# Patient Record
Sex: Female | Born: 1950 | Race: Black or African American | Hispanic: No | Smoking: Former smoker
Health system: Southern US, Community
[De-identification: ages and names within clinical notes are randomized; demographics above are authoritative.]

## PROBLEM LIST (undated history)

## (undated) DIAGNOSIS — K259 Gastric ulcer, unspecified as acute or chronic, without hemorrhage or perforation: Secondary | ICD-10-CM

## (undated) DIAGNOSIS — I10 Essential (primary) hypertension: Secondary | ICD-10-CM

## (undated) DIAGNOSIS — R7303 Prediabetes: Secondary | ICD-10-CM

## (undated) DIAGNOSIS — M1990 Unspecified osteoarthritis, unspecified site: Secondary | ICD-10-CM

## (undated) HISTORY — DX: Essential (primary) hypertension: I10

## (undated) HISTORY — DX: Gastric ulcer, unspecified as acute or chronic, without hemorrhage or perforation: K25.9

---

## 1978-09-26 DIAGNOSIS — K259 Gastric ulcer, unspecified as acute or chronic, without hemorrhage or perforation: Secondary | ICD-10-CM

## 1978-09-26 HISTORY — DX: Gastric ulcer, unspecified as acute or chronic, without hemorrhage or perforation: K25.9

## 2008-05-23 ENCOUNTER — Emergency Department (HOSPITAL_COMMUNITY): Admission: EM | Admit: 2008-05-23 | Discharge: 2008-05-23 | Payer: Self-pay | Admitting: Emergency Medicine

## 2008-08-06 ENCOUNTER — Observation Stay (HOSPITAL_COMMUNITY): Admission: EM | Admit: 2008-08-06 | Discharge: 2008-08-08 | Payer: Self-pay | Admitting: Emergency Medicine

## 2008-09-26 HISTORY — PX: ANKLE FRACTURE SURGERY: SHX122

## 2011-02-08 NOTE — Op Note (Signed)
NAMECONNER, NEISS NO.:  [id number]   MEDICAL RECORD NO.:  [id number]          PATIENT TYPE:  INP   LOCATION:  5038                         FACILITY:  [facility]   PHYSICIAN:  Claude Manges. Whitfield, M.D.DATE OF BIRTH:  01-Jan-1951   DATE OF PROCEDURE:  08/06/2008  DATE OF DISCHARGE:                               OPERATIVE REPORT   PREOPERATIVE DIAGNOSIS:  Displaced left distal fibular fracture with a  tear of the deltoid ligament.   POSTOPERATIVE DIAGNOSIS:  Displaced left distal fibular fracture with a  tear of the deltoid ligament.   PROCEDURES:  1. Open reduction internal fixation, left distal fibula fracture.  2. Repair of deltoid ligament.   SURGEON:  Claude Manges. Cleophas Dunker, MD   ASSISTANT:  Oris Drone. Petrarca, PA-C   ANESTHESIA:  General orotracheal.   COMPLICATIONS:  None.   DESCRIPTION OF PROCEDURE:  The patient was met in the holding area.  The  left lower extremity was marked as the appropriate operative extremity.  The patient was then taken to room #5 where she was placed under general  orotracheal anesthesia.   Tourniquet was applied to the left thigh.  The left leg was then prepped  with Betadine scrub and then DuraPrep in the tips of the toes and the  knee.  Sterile draping was performed.   With the extremity elevated, there was Esmarch exsanguinated with a  proximal tourniquet at 350 mmHg.   A preoperative films and CT scan of the left ankle revealed a displaced  oblique fracture of the distal fibula with the distal extent of the  fracture at the ankle mortise.  CT scan revealed the fibula was well  approximated to the distal tibia without evidence of tib-fib ligament  injury.  There was also a large space medially with lateral position of  the talus consistent with a deltoid ligament injury.  There was a large  gap that we could palpate.  I had some concerns that there may have been  some invagination of the deltoid ligament into the joint.   Accordingly,  initial incision was made along the deltoid ligament.  It was about an  inch length.  Sharp dissection carried down the subcutaneous tissue then  via blunt dissection, subcutaneous tissue was elevated.  The joint was  easily entered.  Hematoma was evacuated.  The deltoid ligament  completely avulsed from the fibula.  It actually had shredded, so there  was a portion had torned distally in a portion from the fifth fibula of  the medial malleolus with periosteal stripping.  The joint was  irrigated.  Part of the ligament had invaginated into the medial sulcus.   We then made a longitudinal incision over the distal fibula and sharp  dissection carried down the subcutaneous tissue and via blunt  dissection, we opened the fascia and entered the periosteum.  There was  immediate hematoma.  The fracture was directly visualized.  There was  oblique beginning proximally posteriorly and then extending anteriorly  and distally.  It was irrigated with saline solution.  Under direct  visualization, we had  to look into the joint as we opened the fracture.  There were no bony fragments.  Under direct visualization, the fracture  was then reduced.  We inserted a single transverse screw at the mid  fracture level after over drilling anteriorly and then filling it with  an appropriate sized cortical screw.  We had excellent purchase of the  screw.  We would just release the bone holding clamp and there was no  motion at the fracture.   A 7-hole semitubular Synthes plate was then applied.  It was bent to  conform to the shape of the fibula.  We sequentially drilled, measured,  and filled the holes with self-tapping cortical screws proximally.  The  distal two screws were filled with fully threaded cancellus screws.  Each of the screws had very nice purchase, so we thought they were well-  approximated to the fibula.  Intensification films were obtained and the  fracture line was obliterated.   We felt we had excellent position of the  screws.   The wound was irrigated with saline solution.  The periosteum closed  anatomically with a running 0 Vicryl, the subcu with similar material,  and the skin closed with skin clips.   We then readdressed the medial malleolar incision.  The wound was again  irrigated.  The deltoid ligament was reapproximated with the 2-0 Vicryl.  The wound was again irrigated with saline solution, the subcu closed  with 2-0 Vicryl, and the skin closed with skin clips.  Sterile bulky  dressing was applied followed by a coaptation and posterior splints and  an Ace bandage.  Tourniquet was deflated with immediate capillary refill  to the toes.   The patient tolerated the procedure without complications.      Claude Manges. Cleophas Dunker, M.D.  Electronically Signed     PWW/MEDQ  D:  08/06/2008  T:  08/07/2008  Job:  [id number]

## 2011-06-28 LAB — DIFFERENTIAL
Basophils Relative: 0
Monocytes Absolute: 0.3
Neutro Abs: 10.3 — ABNORMAL HIGH

## 2011-06-28 LAB — BASIC METABOLIC PANEL
Calcium: 9.4
Chloride: 109
Creatinine, Ser: 0.84
GFR calc Af Amer: >60
Glucose, Bld: 118 — ABNORMAL HIGH
Potassium: 4.1
Sodium: 138

## 2011-06-28 LAB — PROTIME-INR
INR: 0.9
Prothrombin Time: 12.3

## 2011-06-28 LAB — CBC
MCHC: 34
MCV: 80.1
WBC: 11.6 — ABNORMAL HIGH

## 2011-06-28 LAB — ABO/RH: ABO/RH(D): A POS

## 2011-06-28 LAB — TYPE AND SCREEN
ABO/RH(D): A POS
Antibody Screen: NEGATIVE

## 2011-06-28 LAB — APTT: aPTT: 28

## 2013-07-25 ENCOUNTER — Encounter: Payer: Self-pay | Admitting: Gastroenterology

## 2013-09-10 ENCOUNTER — Encounter: Payer: Self-pay | Admitting: Gastroenterology

## 2013-09-10 ENCOUNTER — Ambulatory Visit (AMBULATORY_SURGERY_CENTER): Payer: BC Managed Care – PPO | Admitting: *Deleted

## 2013-09-10 VITALS — Ht 66.5 in | Wt 221.8 lb

## 2013-09-10 DIAGNOSIS — Z1211 Encounter for screening for malignant neoplasm of colon: Secondary | ICD-10-CM

## 2013-09-10 MED ORDER — MOVIPREP 100 G PO SOLR: ORAL | Status: DC

## 2013-09-10 NOTE — Progress Notes (Signed)
No allergies to eggs or soy. No problems with anesthesia.  

## 2013-09-24 ENCOUNTER — Encounter: Payer: Self-pay | Admitting: Gastroenterology

## 2013-09-24 ENCOUNTER — Ambulatory Visit (AMBULATORY_SURGERY_CENTER): Payer: BC Managed Care – PPO | Admitting: Gastroenterology

## 2013-09-24 VITALS — BP 143/88 | HR 78 | Temp 96.3°F | Resp 26 | Ht 66.5 in | Wt 221.0 lb

## 2013-09-24 DIAGNOSIS — Z1211 Encounter for screening for malignant neoplasm of colon: Secondary | ICD-10-CM

## 2013-09-24 MED ORDER — SODIUM CHLORIDE 0.9 % IV SOLN: 500.0000 mL | INTRAVENOUS | Status: DC

## 2013-09-24 NOTE — Progress Notes (Signed)
Patient did not experience any of the following events: a burn prior to discharge; a fall within the facility; wrong site/side/patient/procedure/implant event; or a hospital transfer or hospital admission upon discharge from the facility. (G8907) Patient did not have preoperative order for IV antibiotic SSI prophylaxis. (G8918)  

## 2013-09-24 NOTE — Op Note (Signed)
[facility] [address] [place], [address]   COLONOSCOPY PROCEDURE REPORT  PATIENT: Angelica Yates, Angelica Yates  MR#: [id number] BIRTHDATE: April 07, 1951 , 62  yrs. old GENDER: Female ENDOSCOPIST: Rachael Fee, MD PROCEDURE DATE:  09/24/2013 PROCEDURE:   Colonoscopy, screening First Screening Colonoscopy - Avg.  risk and is 50 yrs.  old or older - No.  Prior Negative Screening - Now for repeat screening. N/A  History of Adenoma - Now for follow-up colonoscopy & has been > or = to 3 yrs.  N/A  Polyps Removed Today? No.  Recommend repeat exam, <10 yrs? No. ASA CLASS:   Class II INDICATIONS:average risk screening. MEDICATIONS: Fentanyl 75 mcg IV, Versed 8 mg IV, and These medications were titrated to patient response per physician's verbal order  DESCRIPTION OF PROCEDURE:   After the risks benefits and alternatives of the procedure were thoroughly explained, informed consent was obtained.  A digital rectal exam revealed no abnormalities of the rectum.   The LB [id number]  endoscope was introduced through the anus and advanced to the cecum, which was identified by both the appendix and ileocecal valve. No adverse events experienced.   The quality of the prep was good.  The instrument was then slowly withdrawn as the colon was fully examined.    COLON FINDINGS: A normal appearing cecum, ileocecal valve, and appendiceal orifice were identified.  The ascending, hepatic flexure, transverse, splenic flexure, descending, sigmoid colon and rectum appeared unremarkable.  No polyps or cancers were seen. Retroflexed views revealed no abnormalities. The time to cecum=2 minutes 43 seconds.  Withdrawal time=8 minutes 32 seconds.  The scope was withdrawn and the procedure completed. COMPLICATIONS: There were no complications.  ENDOSCOPIC IMPRESSION: Normal colon No polyps or cancers  RECOMMENDATIONS: You should continue to follow colorectal cancer screening guidelines for  "routine risk" patients with a repeat colonoscopy in 10 years.    eSigned:  Rachael Fee, MD 09/24/2013 9:58 AM

## 2013-09-24 NOTE — Patient Instructions (Signed)
Discharge instructions given with verbal understanding. Normal exam. Resume previous medications. YOU HAD AN ENDOSCOPIC PROCEDURE TODAY AT THE [facility]: Refer to the procedure report that was given to you for any specific questions about what was found during the examination.  If the procedure report does not answer your questions, please call your gastroenterologist to clarify.  If you requested that your care partner not be given the details of your procedure findings, then the procedure report has been included in a sealed envelope for you to review at your convenience later.  YOU SHOULD EXPECT: Some feelings of bloating in the abdomen. Passage of more gas than usual.  Walking can help get rid of the air that was put into your GI tract during the procedure and reduce the bloating. If you had a lower endoscopy (such as a colonoscopy or flexible sigmoidoscopy) you may notice spotting of blood in your stool or on the toilet paper. If you underwent a bowel prep for your procedure, then you may not have a normal bowel movement for a few days.  DIET: Your first meal following the procedure should be a light meal and then it is ok to progress to your normal diet.  A half-sandwich or bowl of soup is an example of a good first meal.  Heavy or fried foods are harder to digest and may make you feel nauseous or bloated.  Likewise meals heavy in dairy and vegetables can cause extra gas to form and this can also increase the bloating.  Drink plenty of fluids but you should avoid alcoholic beverages for 24 hours.  ACTIVITY: Your care partner should take you home directly after the procedure.  You should plan to take it easy, moving slowly for the rest of the day.  You can resume normal activity the day after the procedure however you should NOT DRIVE or use heavy machinery for 24 hours (because of the sedation medicines used during the test).    SYMPTOMS TO REPORT IMMEDIATELY: A gastroenterologist  can be reached at any hour.  During normal business hours, 8:30 AM to 5:00 PM Monday through Friday, call [phone].  After hours and on weekends, please call the GI answering service at [phone] who will take a message and have the physician on call contact you.   Following lower endoscopy (colonoscopy or flexible sigmoidoscopy):  Excessive amounts of blood in the stool  Significant tenderness or worsening of abdominal pains  Swelling of the abdomen that is new, acute  Fever of 100F or higher  FOLLOW UP: If any biopsies were taken you will be contacted by phone or by letter within the next 1-3 weeks.  Call your gastroenterologist if you have not heard about the biopsies in 3 weeks.  Our staff will call the home number listed on your records the next business day following your procedure to check on you and address any questions or concerns that you may have at that time regarding the information given to you following your procedure. This is a courtesy call and so if there is no answer at the home number and we have not heard from you through the emergency physician on call, we will assume that you have returned to your regular daily activities without incident.  SIGNATURES/CONFIDENTIALITY: You and/or your care partner have signed paperwork which will be entered into your electronic medical record.  These signatures attest to the fact that that the information above on your After Visit Summary has been reviewed   and is understood.  Full responsibility of the confidentiality of this discharge information lies with you and/or your care-partner. 

## 2013-09-25 ENCOUNTER — Telehealth: Payer: Self-pay | Admitting: *Deleted

## 2013-09-25 NOTE — Telephone Encounter (Signed)
  Follow up Call-  Call back number 09/24/2013  Post procedure Call Back phone  # [phone] cell  Permission to leave phone message Yes     Patient questions:  Do you have a fever, pain , or abdominal swelling? no Pain Score  0 *  Have you tolerated food without any problems? yes  Have you been able to return to your normal activities? yes  Do you have any questions about your discharge instructions: Diet   no Medications  no Follow up visit  no  Do you have questions or concerns about your Care? no  Actions: * If pain score is 4 or above: No action needed, pain <4.

## 2017-12-29 ENCOUNTER — Ambulatory Visit: Payer: BC Managed Care – PPO | Admitting: Physician Assistant

## 2017-12-29 ENCOUNTER — Encounter: Payer: Self-pay | Admitting: Physician Assistant

## 2017-12-29 ENCOUNTER — Encounter (INDEPENDENT_AMBULATORY_CARE_PROVIDER_SITE_OTHER): Payer: Self-pay

## 2017-12-29 VITALS — BP 142/84 | HR 72 | Ht 66.5 in | Wt 220.5 lb

## 2017-12-29 DIAGNOSIS — Z8711 Personal history of peptic ulcer disease: Secondary | ICD-10-CM

## 2017-12-29 DIAGNOSIS — Z8719 Personal history of other diseases of the digestive system: Secondary | ICD-10-CM

## 2017-12-29 DIAGNOSIS — R1013 Epigastric pain: Secondary | ICD-10-CM | POA: Diagnosis not present

## 2017-12-29 MED ORDER — OMEPRAZOLE 40 MG PO CPDR: 40.0000 mg | DELAYED_RELEASE_CAPSULE | Freq: Every day | ORAL | 1 refills | Status: DC

## 2017-12-29 MED ORDER — OMEPRAZOLE 20 MG PO CPDR: 20.0000 mg | DELAYED_RELEASE_CAPSULE | Freq: Every day | ORAL | 1 refills | Status: DC

## 2017-12-29 NOTE — Progress Notes (Signed)
Chief Complaint: Epigastric abdominal pain  HPI:    Angelica Yates is a 67 year old female with a past medical history as listed below who has followed with Dr. Christella Hartigan for her screening colonoscopy and presents to clinic today as a new patient for a complaint of abdominal pain.    09/24/13 colonoscopy, normal colon with no polyps or cancers.  Repeat recommended in 10 years.    07/13/17 CMP and CBC normal.     Today, explains that over the past year she has episodes after eating citrus fruits or a lot of cabbage where she will develop epigastric abdominal pain rated as an 8/10 which is sharp in nature.  She will then start over-the-counter Mylanta and use it a couple times a day for the next week and this pain goes away.  It has happened may be 5-6 weeks out of the past year.  If she avoids these certain foods she can avoid symptoms.  Associated with nausea.    Describes history of previous gastric ulcer which perforated.  She did have surgery for this back in the 90s.    Denies fever, chills, blood in her stool, melena, weight loss, fatigue, anorexia, vomiting, heartburn, reflux, use of NSAIDs or symptoms that awaken her at night.  Past Medical History:  Diagnosis Date  . Hypertension   . Stomach ulcer 1980    Past Surgical History:  Procedure Laterality Date  . ANKLE FRACTURE SURGERY Left 2010   with hardware    Current Outpatient Medications  Medication Sig Dispense Refill  . lisinopril-hydrochlorothiazide (PRINZIDE,ZESTORETIC) 20-12.5 MG per tablet Take 1 tablet by mouth daily.    . Multiple Vitamin (MULTIVITAMIN) tablet Take 1 tablet by mouth daily.     No current facility-administered medications for this visit.     Allergies as of 12/29/2017  . (No Known Allergies)    Family History  Problem Relation Age of Onset  . Colon cancer Neg Hx   . Esophageal cancer Neg Hx   . Rectal cancer Neg Hx   . Stomach cancer Neg Hx     Social History   Socioeconomic History  .  Marital status: Divorced    Spouse name: Not on file  . Number of children: Not on file  . Years of education: Not on file  . Highest education level: Not on file  Occupational History  . Not on file  Social Needs  . Financial resource strain: Not on file  . Food insecurity:    Worry: Not on file    Inability: Not on file  . Transportation needs:    Medical: Not on file    Non-medical: Not on file  Tobacco Use  . Smoking status: Former Smoker    Last attempt to quit: 09/26/1990    Years since quitting: 27.2  . Smokeless tobacco: Never Used  Substance and Sexual Activity  . Alcohol use: No  . Drug use: No  . Sexual activity: Not on file  Lifestyle  . Physical activity:    Days per week: Not on file    Minutes per session: Not on file  . Stress: Not on file  Relationships  . Social connections:    Talks on phone: Not on file    Gets together: Not on file    Attends religious service: Not on file    Active member of club or organization: Not on file    Attends meetings of clubs or organizations: Not on file    Relationship  status: Not on file  . Intimate partner violence:    Fear of current or ex partner: Not on file    Emotionally abused: Not on file    Physically abused: Not on file    Forced sexual activity: Not on file  Other Topics Concern  . Not on file  Social History Narrative  . Not on file    Review of Systems:    Constitutional: No weight loss, fever or chills Skin: No rash  Cardiovascular: No chest pain Respiratory: No SOB Gastrointestinal: See HPI and otherwise negative Genitourinary: No dysuria  Neurological: No headache Musculoskeletal: No new muscle or joint pain Hematologic: No bleeding  Psychiatric: No history of depression or anxiety   Physical Exam:  Vital signs: BP (!) 142/84   Pulse 72   Ht 5' 6.5" (1.689 m)   Wt 220 lb 8 oz (100 kg)   BMI 35.06 kg/m    Constitutional:   Pleasant obese AA female appears to be in NAD, Well  developed, Well nourished, alert and cooperative Head:  Normocephalic and atraumatic. Eyes:   PEERL, EOMI. No icterus. Conjunctiva pink. Ears:  Normal auditory acuity. Neck:  Supple Throat: Oral cavity and pharynx without inflammation, swelling or lesion.  Respiratory: Respirations even and unlabored. Lungs clear to auscultation bilaterally.   No wheezes, crackles, or rhonchi.  Cardiovascular: Normal S1, S2. No MRG. Regular rate and rhythm. No peripheral edema, cyanosis or pallor.  Gastrointestinal:  +midline surgical scar Soft, nondistended, nontender. No rebound or guarding. Normal bowel sounds. No appreciable masses or hepatomegaly. Rectal:  Not performed.  Msk:  Symmetrical without gross deformities. Without edema, no deformity or joint abnormality.  Neurologic:  Alert and  oriented x4;  grossly normal neurologically.  Skin:   Dry and intact without significant lesions or rashes. Psychiatric: Demonstrates good judgement and reason without abnormal affect or behaviors.  See HPI for most recent labs.  Assessment: 1. Epigastric Abdominal Pain: Episodic, brought on by certain foods, able to avoid if avoids food triggers, relieved with Mylanta times 1 week, history of gastric ulcer; most likely gastritis  Plan: 1.  Patient would like to try Omeprazole 20 mg daily, 30-60 minutes before breakfast #30 with 1 refill for the next 1-2 months. 2.  Reviewed antireflux diet and lifestyle modifications. 3.  Patient to follow in clinic with me in 4-6 weeks.  If she has a flare of symptoms while on Omeprazole could consider EGD for further evaluation.  Hyacinth MeekerJennifer Makhari Dovidio, PA-C [facility] 12/29/2017, 9:17 AM

## 2017-12-29 NOTE — Patient Instructions (Signed)
We have sent the following medications to your pharmacy for you to pick up at your convenience:  Omeprazole 20 mg daily 30-60 minutes before breakfast

## 2017-12-29 NOTE — Progress Notes (Signed)
I agree with the above note, plan 

## 2018-01-24 ENCOUNTER — Ambulatory Visit: Payer: BC Managed Care – PPO | Admitting: Physician Assistant

## 2019-11-25 ENCOUNTER — Ambulatory Visit: Payer: BC Managed Care – PPO | Attending: Internal Medicine

## 2020-02-05 ENCOUNTER — Emergency Department (HOSPITAL_COMMUNITY): Payer: No Typology Code available for payment source

## 2020-02-05 ENCOUNTER — Emergency Department (HOSPITAL_COMMUNITY)
Admission: EM | Admit: 2020-02-05 | Discharge: 2020-02-05 | Disposition: A | Discharge status: Home / Self Care | Payer: No Typology Code available for payment source | Attending: Emergency Medicine | Admitting: Emergency Medicine

## 2020-02-05 ENCOUNTER — Encounter (HOSPITAL_COMMUNITY): Payer: Self-pay | Admitting: Emergency Medicine

## 2020-02-05 DIAGNOSIS — Y9389 Activity, other specified: Secondary | ICD-10-CM | POA: Diagnosis not present

## 2020-02-05 DIAGNOSIS — S8002XA Contusion of left knee, initial encounter: Secondary | ICD-10-CM | POA: Diagnosis not present

## 2020-02-05 DIAGNOSIS — Y990 Civilian activity done for income or pay: Secondary | ICD-10-CM | POA: Diagnosis not present

## 2020-02-05 DIAGNOSIS — Y92410 Unspecified street and highway as the place of occurrence of the external cause: Secondary | ICD-10-CM | POA: Diagnosis not present

## 2020-02-05 DIAGNOSIS — I10 Essential (primary) hypertension: Secondary | ICD-10-CM | POA: Diagnosis not present

## 2020-02-05 DIAGNOSIS — Z79899 Other long term (current) drug therapy: Secondary | ICD-10-CM | POA: Diagnosis not present

## 2020-02-05 DIAGNOSIS — Z87891 Personal history of nicotine dependence: Secondary | ICD-10-CM | POA: Insufficient documentation

## 2020-02-05 DIAGNOSIS — T148XXA Other injury of unspecified body region, initial encounter: Secondary | ICD-10-CM

## 2020-02-05 DIAGNOSIS — S8992XA Unspecified injury of left lower leg, initial encounter: Secondary | ICD-10-CM | POA: Diagnosis present

## 2020-02-05 DIAGNOSIS — M542 Cervicalgia: Secondary | ICD-10-CM | POA: Diagnosis not present

## 2020-02-05 DIAGNOSIS — S161XXA Strain of muscle, fascia and tendon at neck level, initial encounter: Secondary | ICD-10-CM | POA: Insufficient documentation

## 2020-02-05 MED ORDER — METHOCARBAMOL 500 MG PO TABS: 500.0000 mg | ORAL_TABLET | Freq: Three times a day (TID) | ORAL | 0 refills | Status: AC | PRN
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
## 2020-02-05 NOTE — ED Triage Notes (Signed)
Pt c/o left knee and right shoulder pains.
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
## 2020-02-05 NOTE — ED Notes (Signed)
Pt transported to xray 

## 2020-04-28 ENCOUNTER — Other Ambulatory Visit: Payer: Self-pay | Admitting: Family Medicine

## 2020-04-28 DIAGNOSIS — S32000S Wedge compression fracture of unspecified lumbar vertebra, sequela: Secondary | ICD-10-CM

## 2020-05-07 ENCOUNTER — Other Ambulatory Visit: Payer: BC Managed Care – PPO

## 2020-09-25 ENCOUNTER — Encounter (HOSPITAL_COMMUNITY): Payer: Self-pay | Admitting: Emergency Medicine

## 2020-09-25 ENCOUNTER — Other Ambulatory Visit: Payer: Self-pay

## 2020-09-25 ENCOUNTER — Emergency Department (HOSPITAL_COMMUNITY)
Admission: EM | Admit: 2020-09-25 | Discharge: 2020-09-25 | Disposition: A | Discharge status: Home / Self Care | Payer: BC Managed Care – PPO | Attending: Emergency Medicine | Admitting: Emergency Medicine

## 2020-09-25 DIAGNOSIS — Z5321 Procedure and treatment not carried out due to patient leaving prior to being seen by health care provider: Secondary | ICD-10-CM | POA: Insufficient documentation

## 2020-09-25 DIAGNOSIS — R509 Fever, unspecified: Secondary | ICD-10-CM | POA: Diagnosis present

## 2020-09-25 DIAGNOSIS — U071 COVID-19: Secondary | ICD-10-CM | POA: Diagnosis not present

## 2020-09-25 LAB — RESP PANEL BY RT-PCR (FLU A&B, COVID) ARPGX2
Influenza A by PCR: NEGATIVE
Influenza B by PCR: NEGATIVE
SARS Coronavirus 2 by RT PCR: POSITIVE — AB

## 2020-09-25 NOTE — ED Triage Notes (Signed)
Patient here from home reporting generalized body aches, fever, chills since after Christmas. Tylenol with no relief.

## 2021-08-17 ENCOUNTER — Other Ambulatory Visit: Payer: Self-pay | Admitting: Orthopedic Surgery

## 2021-09-06 NOTE — Progress Notes (Addendum)
COVID swab appointment: n/a  COVID Vaccine Completed: Date COVID Vaccine completed: Has received booster: COVID vaccine manufacturer: Pfizer    [employer]   Date of COVID positive in last 90 days:  PCP - [facility] [place] Cardiologist - n/a  Chest x-ray - 09/08/21 Epic EKG - 09/01/21 with PCP req Stress Test - n/a ECHO - n/a Cardiac Cath - n/a Pacemaker/ICD device last checked: n/a Spinal Cord Stimulator: n/a  Sleep Study - n/a CPAP -   Fasting Blood Sugar - pre DM no check at home Checks Blood Sugar _____ times a day  Blood Thinner Instructions: n/a Aspirin Instructions: Last Dose:  Activity level: Can go up a flight of stairs and perform activities of daily living without stopping and without symptoms of chest pain or shortness of breath.      Anesthesia review:   Patient denies shortness of breath, fever, cough and chest pain at PAT appointment   Patient verbalized understanding of instructions that were given to them at the PAT appointment. Patient was also instructed that they will need to review over the PAT instructions again at home before surgery.

## 2021-09-06 NOTE — Patient Instructions (Addendum)
DUE TO COVID-19 ONLY ONE VISITOR IS ALLOWED TO COME WITH YOU AND STAY IN THE WAITING ROOM ONLY DURING PRE OP AND PROCEDURE.   **NO VISITORS ARE ALLOWED IN THE SHORT STAY AREA OR RECOVERY ROOM!!**       Your procedure is scheduled on: 09/21/21   Report to [facility] Main Entrance    Report to admitting at 5:15 AM   Call this number if you have problems the morning of surgery [phone]   Do not eat food :After Midnight.   May have liquids until 4:30 AM day of surgery  CLEAR LIQUID DIET  Foods Allowed                                                                     Foods Excluded  Water, Black Coffee and tea (no milk or creamer)           liquids that you cannot  Plain Jell-O in any flavor  (No red)                                    see through such as: Fruit ices (not with fruit pulp)                                            milk, soups, orange juice              Iced Popsicles (No red)                                                All solid food                                   Apple juices Sports drinks like Gatorade (No red) Lightly seasoned clear broth or consume(fat free) Sugar     The day of surgery:  Drink ONE (1) Pre-Surgery Clear Ensure by 4:30 am the morning of surgery. Drink in one sitting. Do not sip.  This drink was given to you during your hospital  pre-op appointment visit. Nothing else to drink after completing the  Pre-Surgery Clear Ensure.          If you have questions, please contact your surgeon's office.     Oral Hygiene is also important to reduce your risk of infection.                                    Remember - BRUSH YOUR TEETH THE MORNING OF SURGERY WITH YOUR REGULAR TOOTHPASTE   Take these medicines the morning of surgery with A SIP OF WATER: Tylenol, Tramadol.  You may not have any metal on your body including hair pins, jewelry, and body piercing             Do not wear make-up, lotions,  powders, perfumes, or deodorant  Do not wear nail polish including gel and S&S, artificial/acrylic nails, or any other type of covering on natural nails including finger and toenails. If you have artificial nails, gel coating, etc. that needs to be removed by a nail salon please have this removed prior to surgery or surgery may need to be canceled/ delayed if the surgeon/ anesthesia feels like they are unable to be safely monitored.   Do not shave  48 hours prior to surgery.    Do not bring valuables to the hospital. [facility] IS NOT             RESPONSIBLE   FOR VALUABLES.   Contacts, dentures or bridgework may not be worn into surgery.    Patients discharged on the day of surgery will not be allowed to drive home.   Special Instructions: Bring a copy of your healthcare power of attorney and living will documents         the day of surgery if you haven't scanned them before.              Please read over the following fact sheets you were given: IF YOU HAVE QUESTIONS ABOUT YOUR PRE-OP INSTRUCTIONS PLEASE CALL [phone]- [facility] - Preparing for Surgery Before surgery, you can play an important role.  Because skin is not sterile, your skin needs to be as free of germs as possible.  You can reduce the number of germs on your skin by washing with CHG (chlorahexidine gluconate) soap before surgery.  CHG is an antiseptic cleaner which kills germs and bonds with the skin to continue killing germs even after washing. Please DO NOT use if you have an allergy to CHG or antibacterial soaps.  If your skin becomes reddened/irritated stop using the CHG and inform your nurse when you arrive at Short Stay. Do not shave (including legs and underarms) for at least 48 hours prior to the first CHG shower.  You may shave your face/neck.  Please follow these instructions carefully:  1.  Shower with CHG Soap the night before surgery and the  morning of surgery.  2.  If you choose to wash your  hair, wash your hair first as usual with your normal  shampoo.  3.  After you shampoo, rinse your hair and body thoroughly to remove the shampoo.                             4.  Use CHG as you would any other liquid soap.  You can apply chg directly to the skin and wash.  Gently with a scrungie or clean washcloth.  5.  Apply the CHG Soap to your body ONLY FROM THE NECK DOWN.   Do   not use on face/ open                           Wound or open sores. Avoid contact with eyes, ears mouth and   genitals (private parts).                       Wash face,  Genitals (private parts) with your normal soap.  6.  Wash thoroughly, paying special attention to the area where your    surgery  will be performed.  7.  Thoroughly rinse your body with warm water from the neck down.  8.  DO NOT shower/wash with your normal soap after using and rinsing off the CHG Soap.                9.  Pat yourself dry with a clean towel.            10.  Wear clean pajamas.            11.  Place clean sheets on your bed the night of your first shower and do not  sleep with pets. Day of Surgery : Do not apply any lotions/deodorants the morning of surgery.  Please wear clean clothes to the hospital/surgery center.  FAILURE TO FOLLOW THESE INSTRUCTIONS MAY RESULT IN THE CANCELLATION OF YOUR SURGERY  PATIENT SIGNATURE_________________________________  NURSE SIGNATURE__________________________________  ________________________________________________________________________   Angelica Yates  An incentive spirometer is a tool that can help keep your lungs clear and active. This tool measures how well you are filling your lungs with each breath. Taking long deep breaths may help reverse or decrease the chance of developing breathing (pulmonary) problems (especially infection) following: A long period of time when you are unable to move or be active. BEFORE THE PROCEDURE  If the spirometer includes an indicator to  show your best effort, your nurse or respiratory therapist will set it to a desired goal. If possible, sit up straight or lean slightly forward. Try not to slouch. Hold the incentive spirometer in an upright position. INSTRUCTIONS FOR USE  Sit on the edge of your bed if possible, or sit up as far as you can in bed or on a chair. Hold the incentive spirometer in an upright position. Breathe out normally. Place the mouthpiece in your mouth and seal your lips tightly around it. Breathe in slowly and as deeply as possible, raising the piston or the ball toward the top of the column. Hold your breath for 3-5 seconds or for as long as possible. Allow the piston or ball to fall to the bottom of the column. Remove the mouthpiece from your mouth and breathe out normally. Rest for a few seconds and repeat Steps 1 through 7 at least 10 times every 1-2 hours when you are awake. Take your time and take a few normal breaths between deep breaths. The spirometer may include an indicator to show your best effort. Use the indicator as a goal to work toward during each repetition. After each set of 10 deep breaths, practice coughing to be sure your lungs are clear. If you have an incision (the cut made at the time of surgery), support your incision when coughing by placing a pillow or rolled up towels firmly against it. Once you are able to get out of bed, walk around indoors and cough well. You may stop using the incentive spirometer when instructed by your caregiver.  RISKS AND COMPLICATIONS Take your time so you do not get dizzy or light-headed. If you are in pain, you may need to take or ask for pain medication before doing incentive spirometry. It is harder to take a deep breath if you are having pain. AFTER USE Rest and breathe slowly and easily. It can be helpful to keep track of a log of your progress. Your caregiver can provide you with a simple table to help with this. If  you are using the spirometer at  home, follow these instructions: SEEK MEDICAL CARE IF:  You are having difficultly using the spirometer. You have trouble using the spirometer as often as instructed. Your pain medication is not giving enough relief while using the spirometer. You develop fever of 100.5 F (38.1 C) or higher. SEEK IMMEDIATE MEDICAL CARE IF:  You cough up bloody sputum that had not been present before. You develop fever of 102 F (38.9 C) or greater. You develop worsening pain at or near the incision site. MAKE SURE YOU:  Understand these instructions. Will watch your condition. Will get help right away if you are not doing well or get worse. Document Released: 01/23/2007 Document Revised: 12/05/2011 Document Reviewed: 03/26/2007 [employer] Patient Information 2014 [employer], [place].   ________________________________________________________________________  WHAT IS A BLOOD TRANSFUSION? Blood Transfusion Information  A transfusion is the replacement of blood or some of its parts. Blood is made up of multiple cells which provide different functions. Red blood cells carry oxygen and are used for blood loss replacement. White blood cells fight against infection. Platelets control bleeding. Plasma helps clot blood. Other blood products are available for specialized needs, such as hemophilia or other clotting disorders. BEFORE THE TRANSFUSION  Who gives blood for transfusions?  Healthy volunteers who are fully evaluated to make sure their blood is safe. This is blood bank blood. Transfusion therapy is the safest it has ever been in the practice of medicine. Before blood is taken from a donor, a complete history is taken to make sure that person has no history of diseases nor engages in risky social behavior (examples are intravenous drug use or sexual activity with multiple partners). The donor's travel history is screened to minimize risk of transmitting infections, such as malaria. The donated blood is tested  for signs of infectious diseases, such as HIV and hepatitis. The blood is then tested to be sure it is compatible with you in order to minimize the chance of a transfusion reaction. If you or a relative donates blood, this is often done in anticipation of surgery and is not appropriate for emergency situations. It takes many days to process the donated blood. RISKS AND COMPLICATIONS Although transfusion therapy is very safe and saves many lives, the main dangers of transfusion include:  Getting an infectious disease. Developing a transfusion reaction. This is an allergic reaction to something in the blood you were given. Every precaution is taken to prevent this. The decision to have a blood transfusion has been considered carefully by your caregiver before blood is given. Blood is not given unless the benefits outweigh the risks. AFTER THE TRANSFUSION Right after receiving a blood transfusion, you will usually feel much better and more energetic. This is especially true if your red blood cells have gotten low (anemic). The transfusion raises the level of the red blood cells which carry oxygen, and this usually causes an energy increase. The nurse administering the transfusion will monitor you carefully for complications. HOME CARE INSTRUCTIONS  No special instructions are needed after a transfusion. You may find your energy is better. Speak with your caregiver about any limitations on activity for underlying diseases you may have. SEEK MEDICAL CARE IF:  Your condition is not improving after your transfusion. You develop redness or irritation at the intravenous (IV) site. SEEK IMMEDIATE MEDICAL CARE IF:  Any of the following symptoms occur over the next 12 hours: Shaking chills. You have a temperature by mouth above 102 F (38.9 C), not  controlled by medicine. Chest, back, or muscle pain. People around you feel you are not acting correctly or are confused. Shortness of breath or difficulty  breathing. Dizziness and fainting. You get a rash or develop hives. You have a decrease in urine output. Your urine turns a dark color or changes to pink, red, or brown. Any of the following symptoms occur over the next 10 days: You have a temperature by mouth above 102 F (38.9 C), not controlled by medicine. Shortness of breath. Weakness after normal activity. The white part of the eye turns yellow (jaundice). You have a decrease in the amount of urine or are urinating less often. Your urine turns a dark color or changes to pink, red, or brown. Document Released: 09/09/2000 Document Revised: 12/05/2011 Document Reviewed: 04/28/2008 [facility] Patient Information 2014 [place], [place].  _______________________________________________________________________

## 2021-09-08 ENCOUNTER — Encounter (HOSPITAL_COMMUNITY)
Admission: RE | Admit: 2021-09-08 | Discharge: 2021-09-08 | Disposition: A | Discharge status: Home / Self Care | Payer: No Typology Code available for payment source | Source: Ambulatory Visit | Attending: Orthopedic Surgery | Admitting: Orthopedic Surgery

## 2021-09-08 ENCOUNTER — Ambulatory Visit (HOSPITAL_COMMUNITY)
Admission: RE | Admit: 2021-09-08 | Discharge: 2021-09-08 | Disposition: A | Discharge status: Home / Self Care | Payer: No Typology Code available for payment source | Source: Ambulatory Visit | Attending: Orthopedic Surgery | Admitting: Orthopedic Surgery

## 2021-09-08 ENCOUNTER — Other Ambulatory Visit: Payer: Self-pay

## 2021-09-08 ENCOUNTER — Encounter (HOSPITAL_COMMUNITY): Payer: Self-pay

## 2021-09-08 ENCOUNTER — Other Ambulatory Visit (HOSPITAL_COMMUNITY): Payer: BC Managed Care – PPO

## 2021-09-08 VITALS — BP 158/94 | HR 62 | Temp 98.7°F | Resp 18 | Ht 66.5 in | Wt 215.2 lb

## 2021-09-08 DIAGNOSIS — Z01818 Encounter for other preprocedural examination: Secondary | ICD-10-CM

## 2021-09-08 HISTORY — DX: Unspecified osteoarthritis, unspecified site: M19.90

## 2021-09-08 HISTORY — DX: Prediabetes: R73.03

## 2021-09-08 LAB — SURGICAL PCR SCREEN
MRSA, PCR: NEGATIVE
Staphylococcus aureus: NEGATIVE

## 2021-09-08 LAB — BASIC METABOLIC PANEL
Anion gap: 7 (ref 5–15)
BUN: 24 mg/dL — ABNORMAL HIGH (ref 8–23)
CO2: 25 mmol/L (ref 22–32)
Calcium: 9.3 mg/dL (ref 8.9–10.3)
Chloride: 106 mmol/L (ref 98–111)
Creatinine, Ser: 0.95 mg/dL (ref 0.44–1.00)
GFR, Estimated: >60 mL/min (ref >60)
Glucose, Bld: 113 mg/dL — ABNORMAL HIGH (ref 70–99)
Potassium: 4 mmol/L (ref 3.5–5.1)
Sodium: 138 mmol/L (ref 135–145)

## 2021-09-08 LAB — CBC WITH DIFFERENTIAL/PLATELET
Abs Immature Granulocytes: 0.02 10*3/uL (ref 0.00–0.07)
Basophils Absolute: 0 10*3/uL (ref 0.0–0.1)
Basophils Relative: 1 %
Eosinophils Absolute: 0.2 10*3/uL (ref 0.0–0.5)
Eosinophils Relative: 2 %
HCT: 36.3 % (ref 36.0–46.0)
Hemoglobin: 11.6 g/dL — ABNORMAL LOW (ref 12.0–15.0)
Immature Granulocytes: 0 %
Lymphocytes Relative: 34 %
Lymphs Abs: 2.4 10*3/uL (ref 0.7–4.0)
MCH: 26.7 pg (ref 26.0–34.0)
MCHC: 32 g/dL (ref 30.0–36.0)
MCV: 83.4 fL (ref 80.0–100.0)
Monocytes Absolute: 0.5 10*3/uL (ref 0.1–1.0)
Monocytes Relative: 6 %
Neutro Abs: 4.2 10*3/uL (ref 1.7–7.7)
Neutrophils Relative %: 57 %
Platelets: 289 10*3/uL (ref 150–400)
RBC: 4.35 MIL/uL (ref 3.87–5.11)
RDW: 12.7 % (ref 11.5–15.5)
WBC: 7.3 10*3/uL (ref 4.0–10.5)
nRBC: 0 % (ref 0.0–0.2)

## 2021-09-08 LAB — URINALYSIS, ROUTINE W REFLEX MICROSCOPIC
Bilirubin Urine: NEGATIVE
Glucose, UA: NEGATIVE mg/dL
Hgb urine dipstick: NEGATIVE
Ketones, ur: 5 mg/dL — AB
Leukocytes,Ua: NEGATIVE
Nitrite: NEGATIVE
Protein, ur: NEGATIVE mg/dL
Specific Gravity, Urine: 1.024 (ref 1.005–1.030)
pH: 5 (ref 5.0–8.0)

## 2021-09-08 LAB — PROTIME-INR
INR: 0.9 (ref 0.8–1.2)
Prothrombin Time: 12 seconds (ref 11.4–15.2)

## 2021-09-08 LAB — GLUCOSE, CAPILLARY: Glucose-Capillary: 111 mg/dL — ABNORMAL HIGH (ref 70–99)

## 2021-09-15 DIAGNOSIS — M1712 Unilateral primary osteoarthritis, left knee: Secondary | ICD-10-CM | POA: Diagnosis present

## 2021-09-15 NOTE — H&P (Signed)
TOTAL KNEE ADMISSION H&P  Patient is being admitted for left total knee arthroplasty.  Subjective:  Chief Complaint:left knee pain.  HPI: Angelica Yates, 70 y.o. female, has a history of pain and functional disability in the left knee due to trauma and arthritis and has failed non-surgical conservative treatments for greater than 12 weeks to includeNSAID's and/or analgesics, corticosteriod injections, viscosupplementation injections, supervised PT with diminished ADL's post treatment, and activity modification.  Onset of symptoms was abrupt, starting 1 years ago with rapidlly worsening course since that time. The patient noted no past surgery on the left knee(s).  Patient currently rates pain in the left knee(s) at 10 out of 10 with activity. Patient has night pain, worsening of pain with activity and weight bearing, pain that interferes with activities of daily living, pain with passive range of motion, crepitus, and joint swelling.  Patient has evidence of subchondral sclerosis, periarticular osteophytes, and joint space narrowing by imaging studies.  There is no active infection.  Patient Active Problem List   Diagnosis Date Noted   Degenerative arthritis of left knee 09/15/2021   Past Medical History:  Diagnosis Date   Arthritis    Hypertension    Pre-diabetes    Stomach ulcer 09/26/1978    Past Surgical History:  Procedure Laterality Date   ANKLE FRACTURE SURGERY Left 2010   with hardware    No current facility-administered medications for this encounter.   Current Outpatient Medications  Medication Sig Dispense Refill Last Dose   acetaminophen (TYLENOL) 325 MG tablet Take 650 mg by mouth every 6 (six) hours as needed for mild pain or headache.      diclofenac Sodium (VOLTAREN) 1 % GEL Apply 1 application topically daily as needed (pain).      lisinopril-hydrochlorothiazide (ZESTORETIC) 20-25 MG tablet Take 1 tablet by mouth daily.      meloxicam (MOBIC) 15 MG tablet Take 15 mg  by mouth daily as needed for pain.      methocarbamol (ROBAXIN) 500 MG tablet Take 1 tablet (500 mg total) by mouth every 8 (eight) hours as needed for muscle spasms. 8 tablet 0    polyvinyl alcohol (LIQUIFILM TEARS) 1.4 % ophthalmic solution Place 1 drop into both eyes as needed for dry eyes.      traMADol (ULTRAM) 50 MG tablet Take 50 mg by mouth every 4 (four) hours as needed for moderate pain.      No Known Allergies  Social History   Tobacco Use   Smoking status: Former    Types: Cigarettes    Quit date: 09/26/1990    Years since quitting: 30.9   Smokeless tobacco: Never  Substance Use Topics   Alcohol use: No    Family History  Problem Relation Age of Onset   Colon cancer Neg Hx    Esophageal cancer Neg Hx    Rectal cancer Neg Hx    Stomach cancer Neg Hx      Review of Systems  Constitutional: Negative.   HENT: Negative.    Eyes: Negative.   Respiratory: Negative.    Cardiovascular: Negative.   Gastrointestinal: Negative.   Endocrine: Negative.   Genitourinary: Negative.   Musculoskeletal:  Positive for arthralgias and back pain.  Allergic/Immunologic: Negative.   Neurological: Negative.   Hematological: Negative.   Psychiatric/Behavioral: Negative.     Objective:  Physical Exam Constitutional:      Appearance: Normal appearance. She is normal weight.  HENT:     Head: Normocephalic and atraumatic.  Nose: Nose normal.  Cardiovascular:     Pulses: Normal pulses.  Pulmonary:     Effort: Pulmonary effort is normal.  Musculoskeletal:        General: Swelling and tenderness present.     Cervical back: Normal range of motion and neck supple.     Comments: the patient's left knee has a range from 3-5 to 110.  The patient's right knee has a range from 0-120.  She has tenderness over the medial greater than lateral joint line.  Obvious crepitance with range of motion.  No instability.  Calves are soft and nontender.  Skin:    General: Skin is warm and dry.   Neurological:     General: No focal deficit present.     Mental Status: She is alert and oriented to person, place, and time. Mental status is at baseline.  Psychiatric:        Mood and Affect: Mood normal.        Behavior: Behavior normal.        Thought Content: Thought content normal.        Judgment: Judgment normal.    Vital signs in last 24 hours:    Labs:   Estimated body mass index is 34.21 kg/m as calculated from the following:   Height as of 09/08/21: 5' 6.5" (1.689 m).   Weight as of 09/08/21: 97.6 kg.   Imaging Review Plain radiographs demonstrate near bone-on-bone arthritis medial compartment left knee with subchondral sclerosis and periarticular osteophyte formation.     Assessment/Plan:  End stage arthritis, left knee   The patient history, physical examination, clinical judgment of the provider and imaging studies are consistent with end stage degenerative joint disease of the left knee(s) and total knee arthroplasty is deemed medically necessary. The treatment options including medical management, injection therapy arthroscopy and arthroplasty were discussed at length. The risks and benefits of total knee arthroplasty were presented and reviewed. The risks due to aseptic loosening, infection, stiffness, patella tracking problems, thromboembolic complications and other imponderables were discussed. The patient acknowledged the explanation, agreed to proceed with the plan and consent was signed. Patient is being admitted for inpatient treatment for surgery, pain control, PT, OT, prophylactic antibiotics, VTE prophylaxis, progressive ambulation and ADL's and discharge planning. The patient is planning to be discharged home with home health services     Patient's anticipated LOS is less than 2 midnights, meeting these requirements: - Younger than 65 - Lives within 1 hour of care - Has a competent adult at home to recover with post-op recover - NO history of  -  Chronic pain requiring opiods  - Diabetes  - Coronary Artery Disease  - Heart failure  - Heart attack  - Stroke  - DVT/VTE  - Cardiac arrhythmia  - Respiratory Failure/COPD  - Renal failure  - Anemia  - Advanced Liver disease

## 2021-09-17 LAB — TYPE AND SCREEN
ABO/RH(D): A POS
Antibody Screen: NEGATIVE

## 2021-09-20 NOTE — Anesthesia Preprocedure Evaluation (Addendum)
Anesthesia Evaluation  Patient identified by MRN, date of birth, ID band Patient awake    Reviewed: Allergy & Precautions, H&P , NPO status , Patient's Chart, lab work & pertinent test results  Airway Mallampati: I  TM Distance: >3 FB Neck ROM: Full    Dental no notable dental hx. (+) Poor Dentition, Chipped, Missing, Dental Advisory Given   Pulmonary neg pulmonary ROS, former smoker,    Pulmonary exam normal breath sounds clear to auscultation       Cardiovascular Exercise Tolerance: Good hypertension, Pt. on medications Normal cardiovascular exam Rhythm:Regular Rate:Normal     Neuro/Psych negative neurological ROS  negative psych ROS   GI/Hepatic Neg liver ROS, PUD,   Endo/Other  negative endocrine ROS  Renal/GU negative Renal ROS  negative genitourinary   Musculoskeletal  (+) Arthritis , Osteoarthritis,    Abdominal   Peds negative pediatric ROS (+)  Hematology negative hematology ROS (+)   Anesthesia Other Findings   Reproductive/Obstetrics negative OB ROS                            Anesthesia Physical Anesthesia Plan  ASA: 2  Anesthesia Plan: Spinal, Regional and MAC   Post-op Pain Management:    Induction:   PONV Risk Score and Plan: 2 and Treatment may vary due to age or medical condition and Midazolam  Airway Management Planned: Natural Airway and Nasal Cannula  Additional Equipment: None  Intra-op Plan:   Post-operative Plan:   Informed Consent: I have reviewed the patients History and Physical, chart, labs and discussed the procedure including the risks, benefits and alternatives for the proposed anesthesia with the patient or authorized representative who has indicated his/her understanding and acceptance.       Plan Discussed with: Anesthesiologist and CRNA  Anesthesia Plan Comments: (  )       Anesthesia Quick Evaluation

## 2021-09-21 ENCOUNTER — Ambulatory Visit (HOSPITAL_COMMUNITY): Payer: No Typology Code available for payment source | Admitting: Anesthesiology

## 2021-09-21 ENCOUNTER — Encounter (HOSPITAL_COMMUNITY)
Admission: RE | Disposition: A | Discharge status: Home / Self Care | Payer: Self-pay | Source: Ambulatory Visit | Attending: Orthopedic Surgery

## 2021-09-21 ENCOUNTER — Encounter (HOSPITAL_COMMUNITY): Payer: Self-pay | Admitting: Orthopedic Surgery

## 2021-09-21 ENCOUNTER — Ambulatory Visit (HOSPITAL_COMMUNITY)
Admission: RE | Admit: 2021-09-21 | Discharge: 2021-09-21 | Disposition: A | Discharge status: Home / Self Care | Payer: No Typology Code available for payment source | Source: Ambulatory Visit | Attending: Orthopedic Surgery | Admitting: Orthopedic Surgery

## 2021-09-21 DIAGNOSIS — M1712 Unilateral primary osteoarthritis, left knee: Secondary | ICD-10-CM | POA: Diagnosis present

## 2021-09-21 DIAGNOSIS — R7303 Prediabetes: Secondary | ICD-10-CM | POA: Insufficient documentation

## 2021-09-21 DIAGNOSIS — Z87891 Personal history of nicotine dependence: Secondary | ICD-10-CM | POA: Insufficient documentation

## 2021-09-21 DIAGNOSIS — I10 Essential (primary) hypertension: Secondary | ICD-10-CM | POA: Insufficient documentation

## 2021-09-21 HISTORY — PX: TOTAL KNEE ARTHROPLASTY: SHX125

## 2021-09-21 LAB — GLUCOSE, CAPILLARY: Glucose-Capillary: 67 mg/dL — ABNORMAL LOW (ref 70–99)

## 2021-09-21 SURGERY — ARTHROPLASTY, KNEE, TOTAL
Anesthesia: Monitor Anesthesia Care | Site: Knee | Laterality: Left

## 2021-09-21 MED ORDER — FENTANYL CITRATE (PF) 100 MCG/2ML IJ SOLN
INTRAMUSCULAR | Status: AC
  Filled 2021-09-21: qty 2

## 2021-09-21 MED ORDER — TRANEXAMIC ACID-NACL 1000-0.7 MG/100ML-% IV SOLN: 1000.0000 mg | Freq: Once | INTRAVENOUS | Status: DC

## 2021-09-21 MED ORDER — PROPOFOL 1000 MG/100ML IV EMUL
INTRAVENOUS | Status: AC
  Filled 2021-09-21: qty 100

## 2021-09-21 MED ORDER — FENTANYL CITRATE PF 50 MCG/ML IJ SOSY: 25.0000 ug | PREFILLED_SYRINGE | INTRAMUSCULAR | Status: DC | PRN

## 2021-09-21 MED ORDER — DEXAMETHASONE SODIUM PHOSPHATE 10 MG/ML IJ SOLN
INTRAMUSCULAR | Status: AC
  Filled 2021-09-21: qty 1

## 2021-09-21 MED ORDER — LACTATED RINGERS IV SOLN: INTRAVENOUS | Status: DC

## 2021-09-21 MED ORDER — BUPIVACAINE LIPOSOME 1.3 % IJ SUSP
INTRAMUSCULAR | Status: DC | PRN
  Administered 2021-09-21: 20 mL

## 2021-09-21 MED ORDER — MIDAZOLAM HCL 5 MG/5ML IJ SOLN
INTRAMUSCULAR | Status: DC | PRN
  Administered 2021-09-21 (×2): 1 mg via INTRAVENOUS

## 2021-09-21 MED ORDER — OXYCODONE HCL 5 MG PO TABS
5.0000 mg | ORAL_TABLET | ORAL | Status: DC | PRN
  Administered 2021-09-21: 13:00:00 10 mg via ORAL

## 2021-09-21 MED ORDER — PROPOFOL 500 MG/50ML IV EMUL
INTRAVENOUS | Status: DC | PRN
  Administered 2021-09-21: 50 ug/kg/min via INTRAVENOUS

## 2021-09-21 MED ORDER — TRANEXAMIC ACID-NACL 1000-0.7 MG/100ML-% IV SOLN
1000.0000 mg | INTRAVENOUS | Status: AC
  Administered 2021-09-21: 08:00:00 1000 mg via INTRAVENOUS
  Filled 2021-09-21: qty 100

## 2021-09-21 MED ORDER — HYDROMORPHONE HCL 1 MG/ML IJ SOLN
0.5000 mg | INTRAMUSCULAR | Status: DC | PRN
  Administered 2021-09-21: 14:00:00 1 mg via INTRAVENOUS

## 2021-09-21 MED ORDER — SODIUM CHLORIDE (PF) 0.9 % IJ SOLN
INTRAMUSCULAR | Status: AC
  Filled 2021-09-21: qty 20

## 2021-09-21 MED ORDER — GLYCOPYRROLATE 0.2 MG/ML IJ SOLN
INTRAMUSCULAR | Status: DC | PRN
  Administered 2021-09-21 (×2): .1 mg via INTRAVENOUS

## 2021-09-21 MED ORDER — DEXMEDETOMIDINE (PRECEDEX) IN NS 20 MCG/5ML (4 MCG/ML) IV SYRINGE
PREFILLED_SYRINGE | INTRAVENOUS | Status: DC | PRN
  Administered 2021-09-21: 8 ug via INTRAVENOUS
  Administered 2021-09-21: 4 ug via INTRAVENOUS

## 2021-09-21 MED ORDER — BUPIVACAINE IN DEXTROSE 0.75-8.25 % IT SOLN
INTRATHECAL | Status: DC | PRN
  Administered 2021-09-21: 2 mL via INTRATHECAL

## 2021-09-21 MED ORDER — ACETAMINOPHEN 160 MG/5ML PO SOLN: 325.0000 mg | ORAL | Status: DC | PRN

## 2021-09-21 MED ORDER — SODIUM CHLORIDE (PF) 0.9 % IJ SOLN
INTRAMUSCULAR | Status: DC | PRN
  Administered 2021-09-21: 70 mL

## 2021-09-21 MED ORDER — METHOCARBAMOL 500 MG IVPB - SIMPLE MED: 500.0000 mg | Freq: Four times a day (QID) | INTRAVENOUS | Status: DC | PRN

## 2021-09-21 MED ORDER — OXYCODONE HCL 5 MG PO TABS: 5.0000 mg | ORAL_TABLET | Freq: Once | ORAL | Status: DC | PRN

## 2021-09-21 MED ORDER — MEPERIDINE HCL 50 MG/ML IJ SOLN: 6.2500 mg | INTRAMUSCULAR | Status: DC | PRN

## 2021-09-21 MED ORDER — BUPIVACAINE-EPINEPHRINE (PF) 0.25% -1:200000 IJ SOLN
INTRAMUSCULAR | Status: DC | PRN
  Administered 2021-09-21: 30 mL

## 2021-09-21 MED ORDER — OXYCODONE HCL 5 MG PO TABS: 5.0000 mg | ORAL_TABLET | ORAL | 0 refills | Status: AC | PRN

## 2021-09-21 MED ORDER — TRANEXAMIC ACID-NACL 1000-0.7 MG/100ML-% IV SOLN
INTRAVENOUS | Status: AC
  Administered 2021-09-21: 13:00:00 1000 mg via INTRAVENOUS
  Filled 2021-09-21: qty 100

## 2021-09-21 MED ORDER — LACTATED RINGERS IV BOLUS
250.0000 mL | Freq: Once | INTRAVENOUS | Status: AC
  Administered 2021-09-21: 11:00:00 250 mL via INTRAVENOUS

## 2021-09-21 MED ORDER — OXYCODONE HCL 5 MG/5ML PO SOLN: 5.0000 mg | Freq: Once | ORAL | Status: DC | PRN

## 2021-09-21 MED ORDER — LIDOCAINE HCL (CARDIAC) PF 100 MG/5ML IV SOSY
PREFILLED_SYRINGE | INTRAVENOUS | Status: DC | PRN
  Administered 2021-09-21: 40 mg via INTRAVENOUS

## 2021-09-21 MED ORDER — TRANEXAMIC ACID 1000 MG/10ML IV SOLN
2000.0000 mg | INTRAVENOUS | Status: DC
  Filled 2021-09-21: qty 20

## 2021-09-21 MED ORDER — METHOCARBAMOL 500 MG IVPB - SIMPLE MED
INTRAVENOUS | Status: AC
  Administered 2021-09-21: 12:00:00 500 mg via INTRAVENOUS
  Filled 2021-09-21: qty 50

## 2021-09-21 MED ORDER — LACTATED RINGERS IV BOLUS
500.0000 mL | Freq: Once | INTRAVENOUS | Status: AC
  Administered 2021-09-21: 10:00:00 500 mL via INTRAVENOUS

## 2021-09-21 MED ORDER — HYDROMORPHONE HCL 1 MG/ML IJ SOLN
INTRAMUSCULAR | Status: AC
  Filled 2021-09-21: qty 1

## 2021-09-21 MED ORDER — METHOCARBAMOL 500 MG PO TABS: 500.0000 mg | ORAL_TABLET | Freq: Four times a day (QID) | ORAL | Status: DC | PRN

## 2021-09-21 MED ORDER — BUPIVACAINE LIPOSOME 1.3 % IJ SUSP
INTRAMUSCULAR | Status: AC
  Filled 2021-09-21: qty 20

## 2021-09-21 MED ORDER — ASPIRIN EC 81 MG PO TBEC: 81.0000 mg | DELAYED_RELEASE_TABLET | Freq: Every day | ORAL | 2 refills | Status: AC

## 2021-09-21 MED ORDER — FENTANYL CITRATE (PF) 100 MCG/2ML IJ SOLN
INTRAMUSCULAR | Status: DC | PRN
  Administered 2021-09-21 (×2): 50 ug via INTRAVENOUS

## 2021-09-21 MED ORDER — DEXAMETHASONE SODIUM PHOSPHATE 10 MG/ML IJ SOLN
INTRAMUSCULAR | Status: DC | PRN
  Administered 2021-09-21: 10 mg via INTRAVENOUS

## 2021-09-21 MED ORDER — WATER FOR IRRIGATION, STERILE IR SOLN
Status: DC | PRN
  Administered 2021-09-21: 2000 mL

## 2021-09-21 MED ORDER — PHENYLEPHRINE 40 MCG/ML (10ML) SYRINGE FOR IV PUSH (FOR BLOOD PRESSURE SUPPORT)
PREFILLED_SYRINGE | INTRAVENOUS | Status: DC | PRN
  Administered 2021-09-21 (×2): 80 ug via INTRAVENOUS
  Administered 2021-09-21: 40 ug via INTRAVENOUS
  Administered 2021-09-21: 80 ug via INTRAVENOUS

## 2021-09-21 MED ORDER — LIDOCAINE HCL (PF) 2 % IJ SOLN
INTRAMUSCULAR | Status: AC
  Filled 2021-09-21: qty 5

## 2021-09-21 MED ORDER — CHLORHEXIDINE GLUCONATE 0.12 % MT SOLN
15.0000 mL | Freq: Once | OROMUCOSAL | Status: AC
  Administered 2021-09-21: 06:00:00 15 mL via OROMUCOSAL

## 2021-09-21 MED ORDER — SODIUM CHLORIDE 0.9 % IR SOLN
Status: DC | PRN
  Administered 2021-09-21: 1000 mL

## 2021-09-21 MED ORDER — PHENYLEPHRINE HCL (PRESSORS) 10 MG/ML IV SOLN
INTRAVENOUS | Status: AC
  Filled 2021-09-21: qty 1

## 2021-09-21 MED ORDER — BUPIVACAINE LIPOSOME 1.3 % IJ SUSP: 20.0000 mL | Freq: Once | INTRAMUSCULAR | Status: DC

## 2021-09-21 MED ORDER — BUPIVACAINE-EPINEPHRINE (PF) 0.25% -1:200000 IJ SOLN
INTRAMUSCULAR | Status: AC
  Filled 2021-09-21: qty 30

## 2021-09-21 MED ORDER — GLYCOPYRROLATE 0.2 MG/ML IJ SOLN
INTRAMUSCULAR | Status: AC
  Filled 2021-09-21: qty 1

## 2021-09-21 MED ORDER — ACETAMINOPHEN 325 MG PO TABS: 325.0000 mg | ORAL_TABLET | ORAL | Status: DC | PRN

## 2021-09-21 MED ORDER — TRANEXAMIC ACID-NACL 1000-0.7 MG/100ML-% IV SOLN: 1000.0000 mg | Freq: Once | INTRAVENOUS | Status: AC

## 2021-09-21 MED ORDER — ROPIVACAINE HCL 7.5 MG/ML IJ SOLN
INTRAMUSCULAR | Status: DC | PRN
  Administered 2021-09-21: 20 mL via PERINEURAL

## 2021-09-21 MED ORDER — TRANEXAMIC ACID 1000 MG/10ML IV SOLN
INTRAVENOUS | Status: DC | PRN
  Administered 2021-09-21: 09:00:00 2000 mg via TOPICAL

## 2021-09-21 MED ORDER — MIDAZOLAM HCL 2 MG/2ML IJ SOLN
INTRAMUSCULAR | Status: AC
  Filled 2021-09-21: qty 2

## 2021-09-21 MED ORDER — OXYCODONE HCL 5 MG PO TABS
ORAL_TABLET | ORAL | Status: AC
  Filled 2021-09-21: qty 2

## 2021-09-21 MED ORDER — POVIDONE-IODINE 10 % EX SWAB
2.0000 "application " | Freq: Once | CUTANEOUS | Status: AC
  Administered 2021-09-21: 2 via TOPICAL

## 2021-09-21 MED ORDER — ORAL CARE MOUTH RINSE: 15.0000 mL | Freq: Once | OROMUCOSAL | Status: AC

## 2021-09-21 MED ORDER — ONDANSETRON HCL 4 MG/2ML IJ SOLN
INTRAMUSCULAR | Status: DC | PRN
  Administered 2021-09-21: 4 mg via INTRAVENOUS

## 2021-09-21 MED ORDER — CEFAZOLIN SODIUM-DEXTROSE 2-4 GM/100ML-% IV SOLN
2.0000 g | INTRAVENOUS | Status: AC
  Administered 2021-09-21: 07:00:00 2 g via INTRAVENOUS
  Filled 2021-09-21: qty 100

## 2021-09-21 MED ORDER — PHENYLEPHRINE HCL-NACL 20-0.9 MG/250ML-% IV SOLN
INTRAVENOUS | Status: DC | PRN
  Administered 2021-09-21: 60 ug/min via INTRAVENOUS

## 2021-09-21 MED ORDER — ONDANSETRON HCL 4 MG/2ML IJ SOLN: 4.0000 mg | Freq: Once | INTRAMUSCULAR | Status: DC | PRN

## 2021-09-21 SURGICAL SUPPLY — 51 items
ATTUNE MED DOME PAT 38 KNEE (Knees) ×1 IMPLANT
ATTUNE MED DOME PAT 38MM KNEE (Knees) ×1 IMPLANT
ATTUNE PS FEM LT SZ 5 CEM KNEE (Femur) ×2 IMPLANT
ATTUNE PSRP INSR SZ5 5 KNEE (Insert) ×1 IMPLANT
ATTUNE PSRP INSR SZ5 5MM KNEE (Insert) ×1 IMPLANT
BAG COUNTER SPONGE SURGICOUNT (BAG) IMPLANT
BAG DECANTER FOR FLEXI CONT (MISCELLANEOUS) ×3 IMPLANT
BAG SURGICOUNT SPONGE COUNTING (BAG)
BAG ZIPLOCK 12X15 (MISCELLANEOUS) ×3 IMPLANT
BASE TIBIA ATTUNE KNEE SYS SZ6 (Knees) IMPLANT
BLADE SAG 18X100X1.27 (BLADE) ×3 IMPLANT
BLADE SAW SGTL 11.0X1.19X90.0M (BLADE) ×3 IMPLANT
BLADE SURG SZ10 CARB STEEL (BLADE) ×6 IMPLANT
BNDG ELASTIC 6X10 VLCR STRL LF (GAUZE/BANDAGES/DRESSINGS) ×3 IMPLANT
BOWL SMART MIX CTS (DISPOSABLE) ×3 IMPLANT
CEMENT HV SMART SET (Cement) ×6 IMPLANT
COVER SURGICAL LIGHT HANDLE (MISCELLANEOUS) ×3 IMPLANT
CUFF TOURN SGL QUICK 34 (TOURNIQUET CUFF) ×2
CUFF TRNQT CYL 34X4.125X (TOURNIQUET CUFF) ×1 IMPLANT
DECANTER SPIKE VIAL GLASS SM (MISCELLANEOUS) ×9 IMPLANT
DRAPE INCISE IOBAN 66X45 STRL (DRAPES) ×9 IMPLANT
DRAPE U-SHAPE 47X51 STRL (DRAPES) ×3 IMPLANT
DRSG AQUACEL AG ADV 3.5X10 (GAUZE/BANDAGES/DRESSINGS) ×3 IMPLANT
DURAPREP 26ML APPLICATOR (WOUND CARE) ×3 IMPLANT
ELECT REM PT RETURN 15FT ADLT (MISCELLANEOUS) ×3 IMPLANT
GLOVE SRG 8 PF TXTR STRL LF DI (GLOVE) ×1 IMPLANT
GLOVE SURG ENC MOIS LTX SZ7.5 (GLOVE) ×3 IMPLANT
GLOVE SURG ENC MOIS LTX SZ8.5 (GLOVE) ×3 IMPLANT
GLOVE SURG UNDER POLY LF SZ8 (GLOVE) ×2
GLOVE SURG UNDER POLY LF SZ9 (GLOVE) ×3 IMPLANT
GOWN STRL REUS W/TWL XL LVL3 (GOWN DISPOSABLE) ×6 IMPLANT
HANDPIECE INTERPULSE COAX TIP (DISPOSABLE) ×2
HOOD PEEL AWAY FLYTE STAYCOOL (MISCELLANEOUS) ×9 IMPLANT
KIT TURNOVER KIT A (KITS) IMPLANT
NDL HYPO 21X1.5 SAFETY (NEEDLE) ×2 IMPLANT
NEEDLE HYPO 21X1.5 SAFETY (NEEDLE) ×6 IMPLANT
NS IRRIG 1000ML POUR BTL (IV SOLUTION) ×3 IMPLANT
PACK TOTAL KNEE CUSTOM (KITS) ×3 IMPLANT
PROTECTOR NERVE ULNAR (MISCELLANEOUS) ×3 IMPLANT
SET HNDPC FAN SPRY TIP SCT (DISPOSABLE) ×1 IMPLANT
SUT VIC AB 1 CTX 36 (SUTURE) ×2
SUT VIC AB 1 CTX36XBRD ANBCTR (SUTURE) ×1 IMPLANT
SUT VIC AB 3-0 CT1 27 (SUTURE) ×6
SUT VIC AB 3-0 CT1 TAPERPNT 27 (SUTURE) ×3 IMPLANT
SYR CONTROL 10ML LL (SYRINGE) ×6 IMPLANT
TIBIA ATTUNE KNEE SYS BASE SZ6 (Knees) ×3 IMPLANT
TRAY CATH INTERMITTENT SS 16FR (CATHETERS) IMPLANT
TRAY FOLEY MTR SLVR 16FR STAT (SET/KITS/TRAYS/PACK) ×3 IMPLANT
TUBE SUCTION HIGH CAP CLEAR NV (SUCTIONS) ×2 IMPLANT
WATER STERILE IRR 1000ML POUR (IV SOLUTION) ×6 IMPLANT
WRAP KNEE MAXI GEL POST OP (GAUZE/BANDAGES/DRESSINGS) ×3 IMPLANT

## 2021-09-21 NOTE — Evaluation (Signed)
Physical Therapy Evaluation Patient Details Name: Angelica Yates MRN: [id number] DOB: 09/26/1951 Today's Date: 09/21/2021  History of Present Illness  s/p L TKA. PMH: ORIF ankle 2010, HTN, OA  Clinical Impression  Patient evaluated by Physical Therapy with no further acute PT needs identified. All education has been completed and the patient has no further questions.  Pt doing well today. See below for mobility reviewed in addition to HEP. Pt tol well and demo'd good carryover. She is ready to d/c with family assist as needed.   See below for any follow-up Physical Therapy or equipment needs. PT is signing off. Thank you for this referral.        Recommendations for follow up therapy are one component of a multi-disciplinary discharge planning process, led by the attending physician.  Recommendations may be updated based on patient status, additional functional criteria and insurance authorization.  Follow Up Recommendations Follow physician's recommendations for discharge plan and follow up therapies    Assistance Recommended at Discharge Intermittent Supervision/Assistance  Functional Status Assessment Patient has had a recent decline in their functional status and demonstrates the ability to make significant improvements in function in a reasonable and predictable amount of time.  Equipment Recommendations  Rolling walker (2 wheels)    Recommendations for Other Services       Precautions / Restrictions Precautions Precautions: Fall;Knee Restrictions Weight Bearing Restrictions: No Other Position/Activity Restrictions: WBAT      Mobility  Bed Mobility Overal bed mobility: Needs Assistance Bed Mobility: Supine to Sit;Sit to Supine     Supine to sit: Supervision Sit to supine: Supervision   General bed mobility comments: for safety    Transfers Overall transfer level: Needs assistance Equipment used: Rolling walker (2 wheels)               General transfer  comment: cues for hand placement and safety    Ambulation/Gait Ambulation/Gait assistance: Min guard Gait Distance (Feet): 80 Feet (x2) Assistive device: Rolling walker (2 wheels) Gait Pattern/deviations: Step-to pattern;Decreased stance time - left       General Gait Details: cues for sequence, RW position  Stairs            Wheelchair Mobility    Modified Rankin (Stroke Patients Only)       Balance Overall balance assessment: Needs assistance   Sitting balance-Leahy Scale: Good       Standing balance-Leahy Scale: Fair Standing balance comment: reliant on device for dynamic                             Pertinent Vitals/Pain Pain Assessment: 0-10 Pain Location: L knee Pain Descriptors / Indicators: Aching;Grimacing;Guarding;Spasm Pain Intervention(s): Monitored during session;Limited activity within patient's tolerance;Premedicated before session;Repositioned    Home Living Family/patient expects to be discharged to:: Private residence Living Arrangements: Other relatives Available Help at Discharge: Family Type of Home: House Home Access: Level entry       Home Layout: Two level;Able to live on main level with bedroom/bathroom Home Equipment: None      Prior Function Prior Level of Function : Independent/Modified Independent                     Hand Dominance        Extremity/Trunk Assessment   Upper Extremity Assessment Upper Extremity Assessment: Overall WFL for tasks assessed    Lower Extremity Assessment Lower Extremity Assessment: LLE deficits/detail LLE Deficits / Details:  ankel WFL, knee and hip 2+ to 3/5       Communication   Communication: No difficulties  Cognition Arousal/Alertness: Awake/alert Behavior During Therapy: WFL for tasks assessed/performed Overall Cognitive Status: Within Functional Limits for tasks assessed                                          General Comments       Exercises Total Joint Exercises Ankle Circles/Pumps: AROM;Both;10 reps Quad Sets: AROM;Both;10 reps Heel Slides: AAROM;Left;10 reps Straight Leg Raises: AROM;AAROM;Left;10 reps   Assessment/Plan    PT Assessment All further PT needs can be met in the next venue of care  PT Problem List         PT Treatment Interventions      PT Goals (Current goals can be found in the Care Plan section)  Acute Rehab PT Goals Patient Stated Goal: home today PT Goal Formulation: All assessment and education complete, DC therapy    Frequency     Barriers to discharge        Co-evaluation               AM-PAC PT "6 Clicks" Mobility  Outcome Measure Help needed turning from your back to your side while in a flat bed without using bedrails?: A Little Help needed moving from lying on your back to sitting on the side of a flat bed without using bedrails?: A Little Help needed moving to and from a bed to a chair (including a wheelchair)?: A Little Help needed standing up from a chair using your arms (e.g., wheelchair or bedside chair)?: A Little Help needed to walk in hospital room?: A Little Help needed climbing 3-5 steps with a railing? : A Little 6 Click Score: 18    End of Session Equipment Utilized During Treatment: Gait belt Activity Tolerance: Patient tolerated treatment well Patient left: in bed;with call bell/phone within reach Nurse Communication: Mobility status PT Visit Diagnosis: Difficulty in walking, not elsewhere classified (R26.2)    Time: 1886-7737 PT Time Calculation (min) (ACUTE ONLY): 37 min   Charges:   PT Evaluation $PT Eval Low Complexity: 1 Low PT Treatments $Gait Training: 8-22 mins        Baxter Flattery, PT  Acute Rehab Dept ([facility]) [phone] Pager [phone]  09/21/2021   [employer] [facility] [employer] [facility] 09/21/2021, 1:48 PM

## 2021-09-21 NOTE — Op Note (Signed)
PATIENT ID:      Angelica Yates  MRN:     [id number] DOB/AGE:    [birth year] / 70 y.o.       OPERATIVE REPORT   DATE OF PROCEDURE:  09/21/2021      PREOPERATIVE DIAGNOSIS:   LEFT KNEE OSETOARTHRITIS      Estimated body mass index is 34.21 kg/m as calculated from the following:   Height as of 09/08/21: 5' 6.5" (1.689 m).   Weight as of 09/08/21: 97.6 kg.                                                       POSTOPERATIVE DIAGNOSIS:   Same                                                                  PROCEDURE:  Procedure(s): LEFT TOTAL KNEE ARTHROPLASTY Using DepuyAttune RP implants #5L Femur, #6Tibia, 5 mm Attune RP bearing, 38 Patella    SURGEON: Nestor Lewandowsky  ASSISTANT:   Tomi Likens. [employer]   (Present and scrubbed throughout the case, critical for assistance with exposure, retraction, instrumentation, and closure.)        ANESTHESIA: spinal, 20cc Exparel, 50cc 0.25% Marcaine EBL: 300 cc FLUID REPLACEMENT: 1600 cc crystaloid TOURNIQUET: DRAINS: None TRANEXAMIC ACID: 1gm IV, 2gm topical COMPLICATIONS:  None         INDICATIONS FOR PROCEDURE: The patient has  LEFT KNEE OSETOARTHRITIS, Var deformities, XR shows bone on bone arthritis, lateral subluxation of tibia. Patient has failed all conservative measures including anti-inflammatory medicines, narcotics, attempts at exercise and weight loss, cortisone injections and viscosupplementation.  Risks and benefits of surgery have been discussed, questions answered.   DESCRIPTION OF PROCEDURE: The patient identified by armband, received  IV antibiotics, in the holding area at [facility]. Patient taken to the operating room, appropriate anesthetic monitors were attached, and spinal anesthesia was  induced. IV Tranexamic acid was given.Tourniquet applied high to the operative thigh. Lateral post and foot positioner applied to the table, the lower extremity was then prepped and draped in usual sterile fashion from the toes to the  tourniquet. Time-out procedure was performed. Tomi Likens. [facility] PAC, was present and scrubbed throughout the case, critical for assistance with, positioning, exposure, retraction, instrumentation, and closure.The skin and subcutaneous tissue along the incision was injected with 20 cc of a mixture of Exparel and Marcaine solution, using a 20-gauge by 1-1/2 inch needle. We began the operation, with the knee flexed 130 degrees, by making the anterior midline incision starting at handbreadth above the patella going over the patella 1 cm medial to and 4 cm distal to the tibial tubercle. Small bleeders in the skin and the subcutaneous tissue identified and cauterized. Transverse retinaculum was incised and reflected medially and a medial parapatellar arthrotomy was accomplished. the patella was everted and theprepatellar fat pad resected. The superficial medial collateral ligament was then elevated from anterior to posterior along the proximal flare of the tibia and anterior half of the menisci resected. The knee was hyperflexed exposing bone on bone arthritis. Peripheral and notch  osteophytes as well as the cruciate ligaments were then resected. We continued to work our way around posteriorly along the proximal tibia, and externally rotated the tibia subluxing it out from underneath the femur. A McHale PCL retractor was placed through the notch and a lateral Hohmann retractor placed, and we then entered the proximal tibia in line with the Depuy starter drill in line with the axis of the tibia followed by an intramedullary guide rod and 0-degree posterior slope cutting guide. The tibial cutting guide, 4 degree posterior sloped, was pinned into place allowing resection of 2 mm of bone medially and 12 mm of bone laterally. Satisfied with the tibial resection, we then entered the distal femur 2 mm anterior to the PCL origin with the intramedullary guide rod and applied the distal femoral cutting guide set at 9 mm, with 5  degrees of valgus. This was pinned along the epicondylar axis. At this point, the distal femoral cut was accomplished without difficulty. We then sized for a #5L femoral component and pinned the guide in 3 degrees of external rotation. The chamfer cutting guide was pinned into place. The anterior, posterior, and chamfer cuts were accomplished without difficulty followed by the Attune RP box cutting guide and the box cut. We also removed posterior osteophytes from the posterior femoral condyles. The posterior capsule was injected with Exparel solution. The knee was brought into full extension. We checked our extension gap and fit a 5 mm bearing. Distracting in extension with a lamina spreader,  bleeders in the posterior capsule, Posterior medial and posterior lateral gutter were cauterized.  The transexamic acid-soaked sponge was then placed in the gap of the knee in extension. The knee was flexed 30. The posterior patella cut was accomplished with the 9.5 mm Attune cutting guide, sized for a 62mm dome, and the fixation pegs drilled.The knee was then once again hyperflexed exposing the proximal tibia. We sized for a # 6 tibial base plate, applied the smokestack and the conical reamer followed by the the Delta fin keel punch. We then hammered into place the Attune RP trial femoral component, drilled the lugs, inserted a  5 mm trial bearing, trial patellar button, and took the knee through range of motion from 0-130 degrees. Medial and lateral ligamentous stability was checked. No thumb pressure was required for patellar Tracking. The tourniquet was not used. All trial components were removed, mating surfaces irrigated with pulse lavage, and dried with suction and sponges. 10 cc of the Exparel solution was applied to the cancellus bone of the patella distal femur and proximal tibia.  After waiting 30 seconds, the bony surfaces were again, dried with sponges. A double batch of DePuy HV cement was mixed and applied to  all bony metallic mating surfaces except for the posterior condyles of the femur itself. In order, we hammered into place the tibial tray and removed excess cement, the femoral component and removed excess cement. The final Attune RP bearing was inserted, and the knee brought to full extension with compression. The patellar button was clamped into place, and excess cement removed. The knee was held at 30 flexion with compression, while the cement cured. The wound was irrigated out with normal saline solution pulse lavage. The rest of the Exparel was injected into the parapatellar arthrotomy, subcutaneous tissues, and periosteal tissues. The parapatellar arthrotomy was closed with running #1 Vicryl suture. The subcutaneous tissue with 3-0 undyed Vicryl suture, and the skin with running 3-0 SQ vicryl. An Aquacil and Ace wrap  were applied. The patient was taken to recovery room without difficulty.   Nestor Lewandowsky 09/21/2021, 7:07 AM

## 2021-09-21 NOTE — Progress Notes (Signed)
Orthopedic Tech Progress Note Patient Details:  Angelica Yates 09-Aug-1951 [id number]  Patient ID: Angelica Yates, female   DOB: 1951/09/14, 70 y.o.   MRN: [id number]  Angelica Yates 09/21/2021, 10:29 AM Bone foam applied to left leg in pacu

## 2021-09-21 NOTE — Anesthesia Procedure Notes (Signed)
Anesthesia Regional Block: Adductor canal block   Pre-Anesthetic Checklist: , timeout performed,  Correct Patient, Correct Site, Correct Laterality,  Correct Procedure, Correct Position, site marked,  Risks and benefits discussed,  Surgical consent,  Pre-op evaluation,  At surgeon's request and post-op pain management  Laterality: Left  Prep: chloraprep       Needles:  Injection technique: Single-shot  Needle Type: Echogenic Stimulator Needle     Needle Length: 5cm  Needle Gauge: 22     Additional Needles:   Procedures:, nerve stimulator,,, ultrasound used (permanent image in chart),,    Narrative:  Start time: 09/21/2021 7:00 AM End time: 09/21/2021 7:11 AM Injection made incrementally with aspirations every 5 mL.  Performed by: Personally  Anesthesiologist: Bethena Midget, MD  Additional Notes: Functioning IV was confirmed and monitors were applied.  A 30mm 22ga Arrow echogenic stimulator needle was used. Sterile prep and drape,hand hygiene and sterile gloves were used. Ultrasound guidance: relevant anatomy identified, needle position confirmed, local anesthetic spread visualized around nerve(s)., vascular puncture avoided.  Image printed for medical record. Negative aspiration and negative test dose prior to incremental administration of local anesthetic. The patient tolerated the procedure well.

## 2021-09-21 NOTE — Anesthesia Postprocedure Evaluation (Signed)
Anesthesia Post Note  Patient: Sayuri A Raya  Procedure(s) Performed: LEFT TOTAL KNEE ARTHROPLASTY (Left: Knee)     Patient location during evaluation: PACU Anesthesia Type: Regional Level of consciousness: oriented and awake and alert Pain management: pain level controlled Vital Signs Assessment: post-procedure vital signs reviewed and stable Respiratory status: spontaneous breathing, respiratory function stable and patient connected to nasal cannula oxygen Cardiovascular status: blood pressure returned to baseline and stable Postop Assessment: no headache, no backache and no apparent nausea or vomiting Anesthetic complications: no   No notable events documented.  Last Vitals:  Vitals:   09/21/21 0609 09/21/21 0941  BP: (!) 161/92 126/70  Pulse: 68 65  Resp: 14 14  Temp: 36.6 C (!) 36.2 C  SpO2: 99% 100%    Last Pain:  Vitals:   09/21/21 0941  TempSrc:   PainSc: 0-No pain                 Amari Zagal

## 2021-09-21 NOTE — Transfer of Care (Signed)
Immediate Anesthesia Transfer of Care Note  Patient: Angelica Yates  Procedure(s) Performed: LEFT TOTAL KNEE ARTHROPLASTY (Left: Knee)  Patient Location: PACU  Anesthesia Type:Spinal  Level of Consciousness: awake, alert , oriented and patient cooperative  Airway & Oxygen Therapy: Patient Spontanous Breathing and Patient connected to face mask oxygen  Post-op Assessment: Report given to RN and Post -op Vital signs reviewed and stable  Post vital signs: Reviewed and stable  Last Vitals:  Vitals Value Taken Time  BP 126/70 09/21/21 0941  Temp    Pulse 60 09/21/21 0943  Resp 14 09/21/21 0943  SpO2 100 % 09/21/21 0943  Vitals shown include unvalidated device data.  Last Pain:  Vitals:   09/21/21 0609  TempSrc: Oral         Complications: No notable events documented.

## 2021-09-21 NOTE — Anesthesia Procedure Notes (Signed)
Spinal  Patient location during procedure: OR Start time: 09/21/2021 7:38 AM End time: 09/21/2021 7:42 AM Staffing Performed: resident/CRNA  Resident/CRNA: Garth Bigness, CRNA Preanesthetic Checklist Completed: patient identified, IV checked, site marked, risks and benefits discussed, surgical consent, monitors and equipment checked, pre-op evaluation and timeout performed Spinal Block Patient position: sitting Prep: DuraPrep Patient monitoring: heart rate, cardiac monitor, continuous pulse ox and blood pressure Approach: midline Location: L4-5 Injection technique: single-shot Needle Needle type: Pencan  Needle gauge: 24 G Needle length: 9 cm Needle insertion depth: 8 cm Assessment Events: CSF return

## 2021-09-21 NOTE — Interval H&P Note (Signed)
History and Physical Interval Note:  09/21/2021 7:06 AM  Angelica Yates  has presented today for surgery, with the diagnosis of LEFT KNEE OSETOARTHRITIS.  The various methods of treatment have been discussed with the patient and family. After consideration of risks, benefits and other options for treatment, the patient has consented to  Procedure(s): LEFT TOTAL KNEE ARTHROPLASTY (Left) as a surgical intervention.  The patient's history has been reviewed, patient examined, no change in status, stable for surgery.  I have reviewed the patient's chart and labs.  Questions were answered to the patient's satisfaction.     Nestor Lewandowsky

## 2021-09-21 NOTE — OR Nursing (Signed)
Intermittent catheterization performed. of urine returned.

## 2021-09-21 NOTE — Discharge Instructions (Signed)

## 2021-09-21 NOTE — Progress Notes (Signed)
Pt pre-diabetic, not on any diabetic meds. CBG 67. Pt denies any symptoms. Educated to make nurse aware if she becomes symptomatic.

## 2021-09-22 ENCOUNTER — Encounter (HOSPITAL_COMMUNITY): Payer: Self-pay | Admitting: Orthopedic Surgery

## 2022-04-30 IMAGING — CR DG CHEST 2V
3 series · 3 of 3 positions shown · non-contrast
Comparison: Chest x-ray 08/06/2008.

CLINICAL DATA: 70-year-old female under preoperative evaluation
prior to knee surgery.

EXAM:
CHEST - 2 VIEW

[w chest pa (1 of 2)]
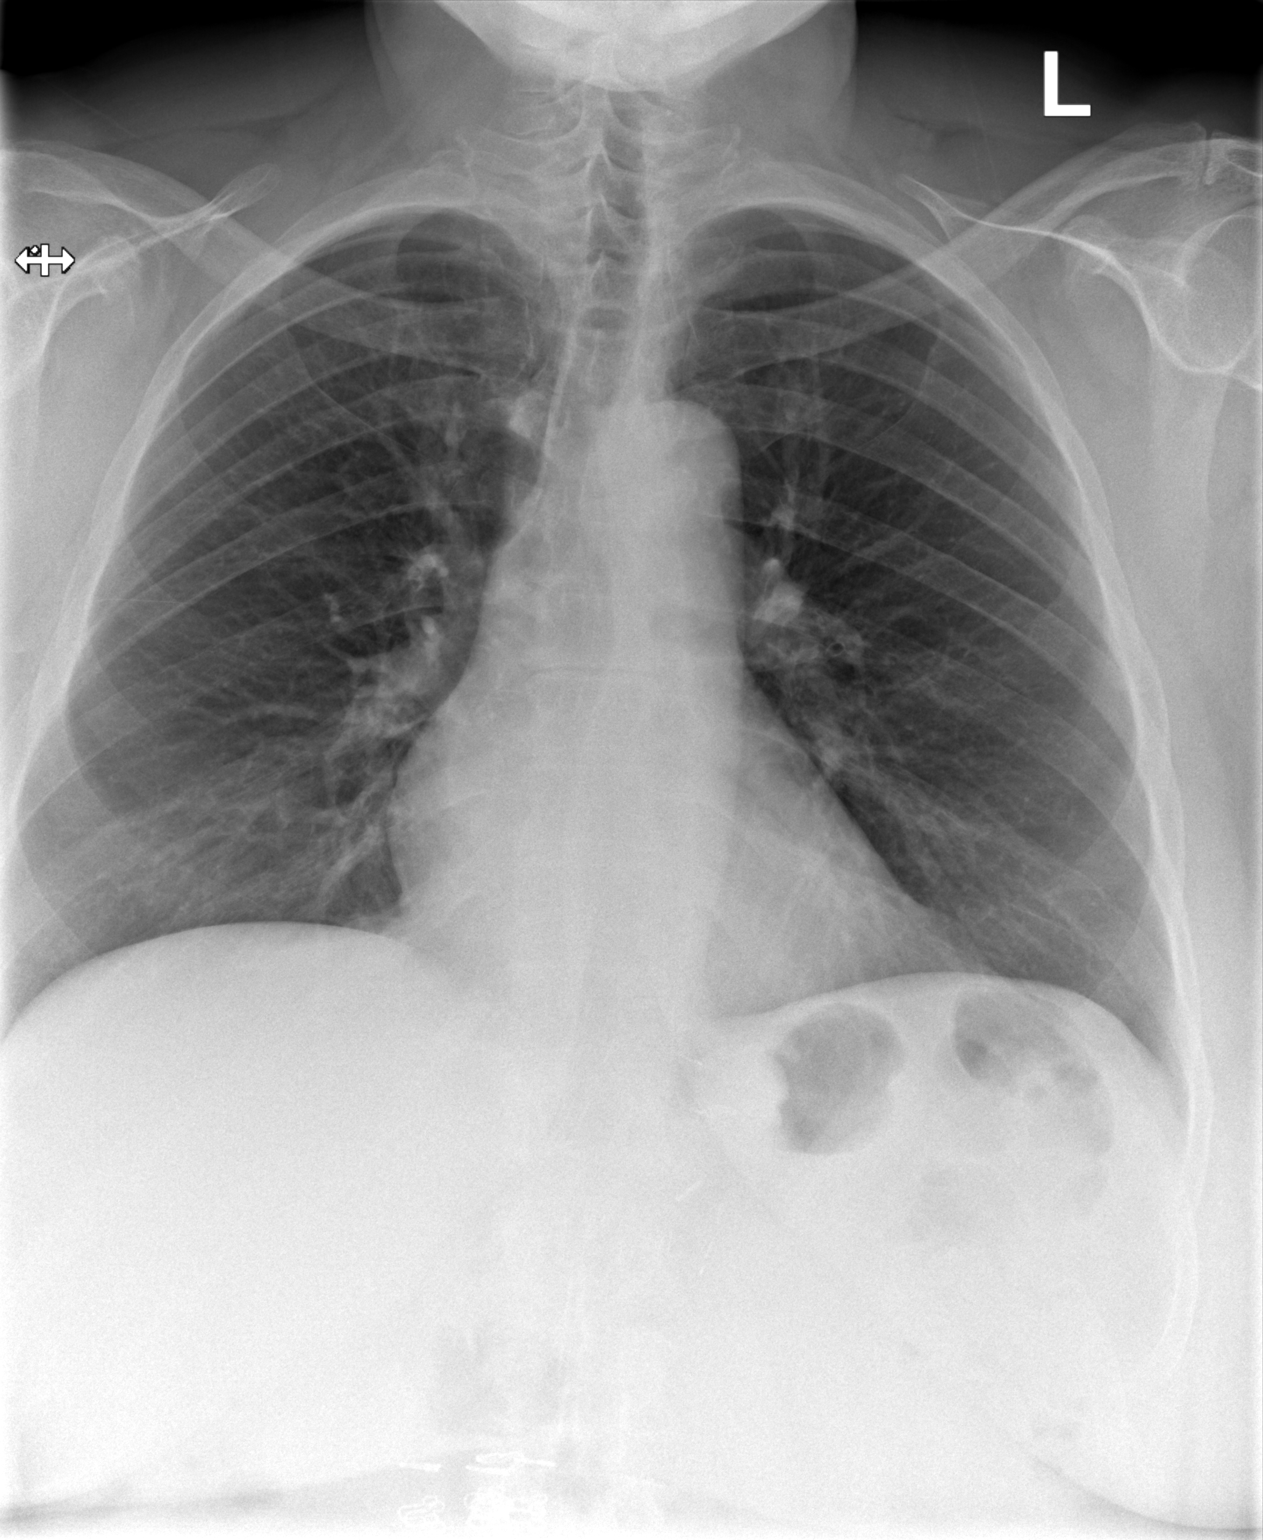

[w chest pa (2 of 2)]
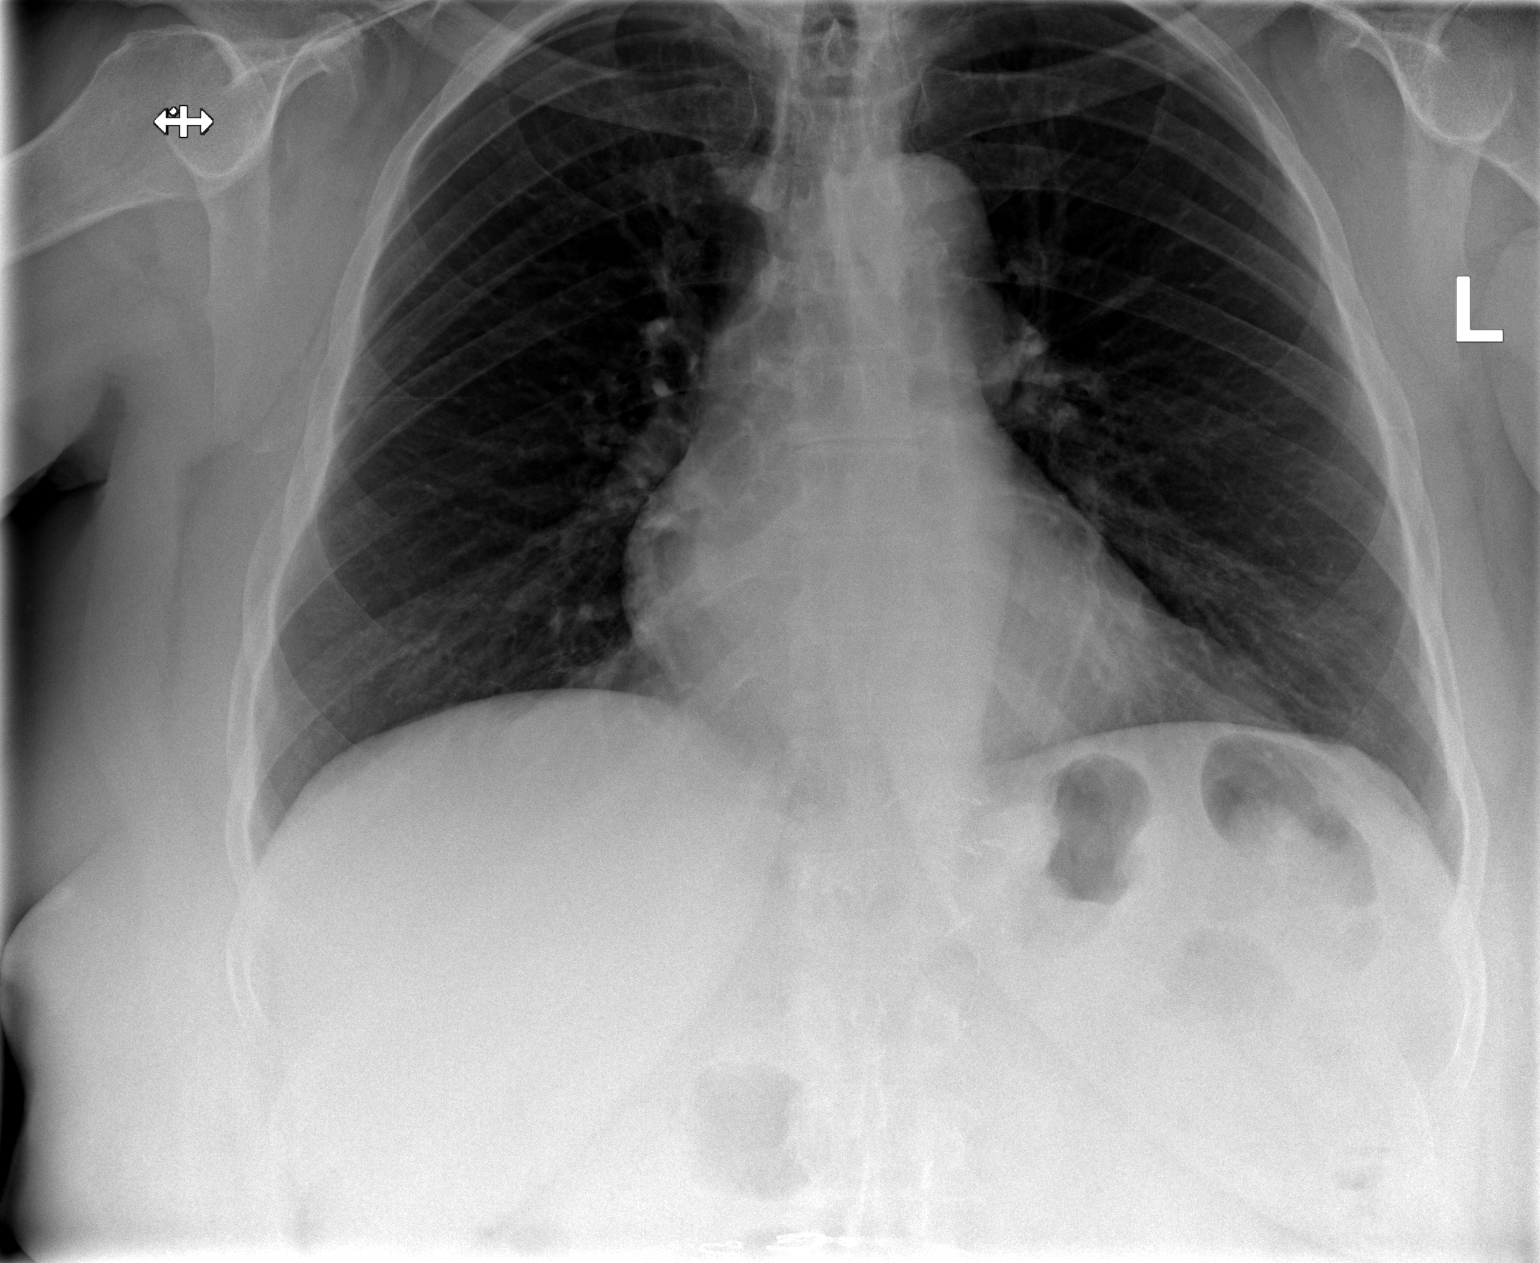

[w chest lat]
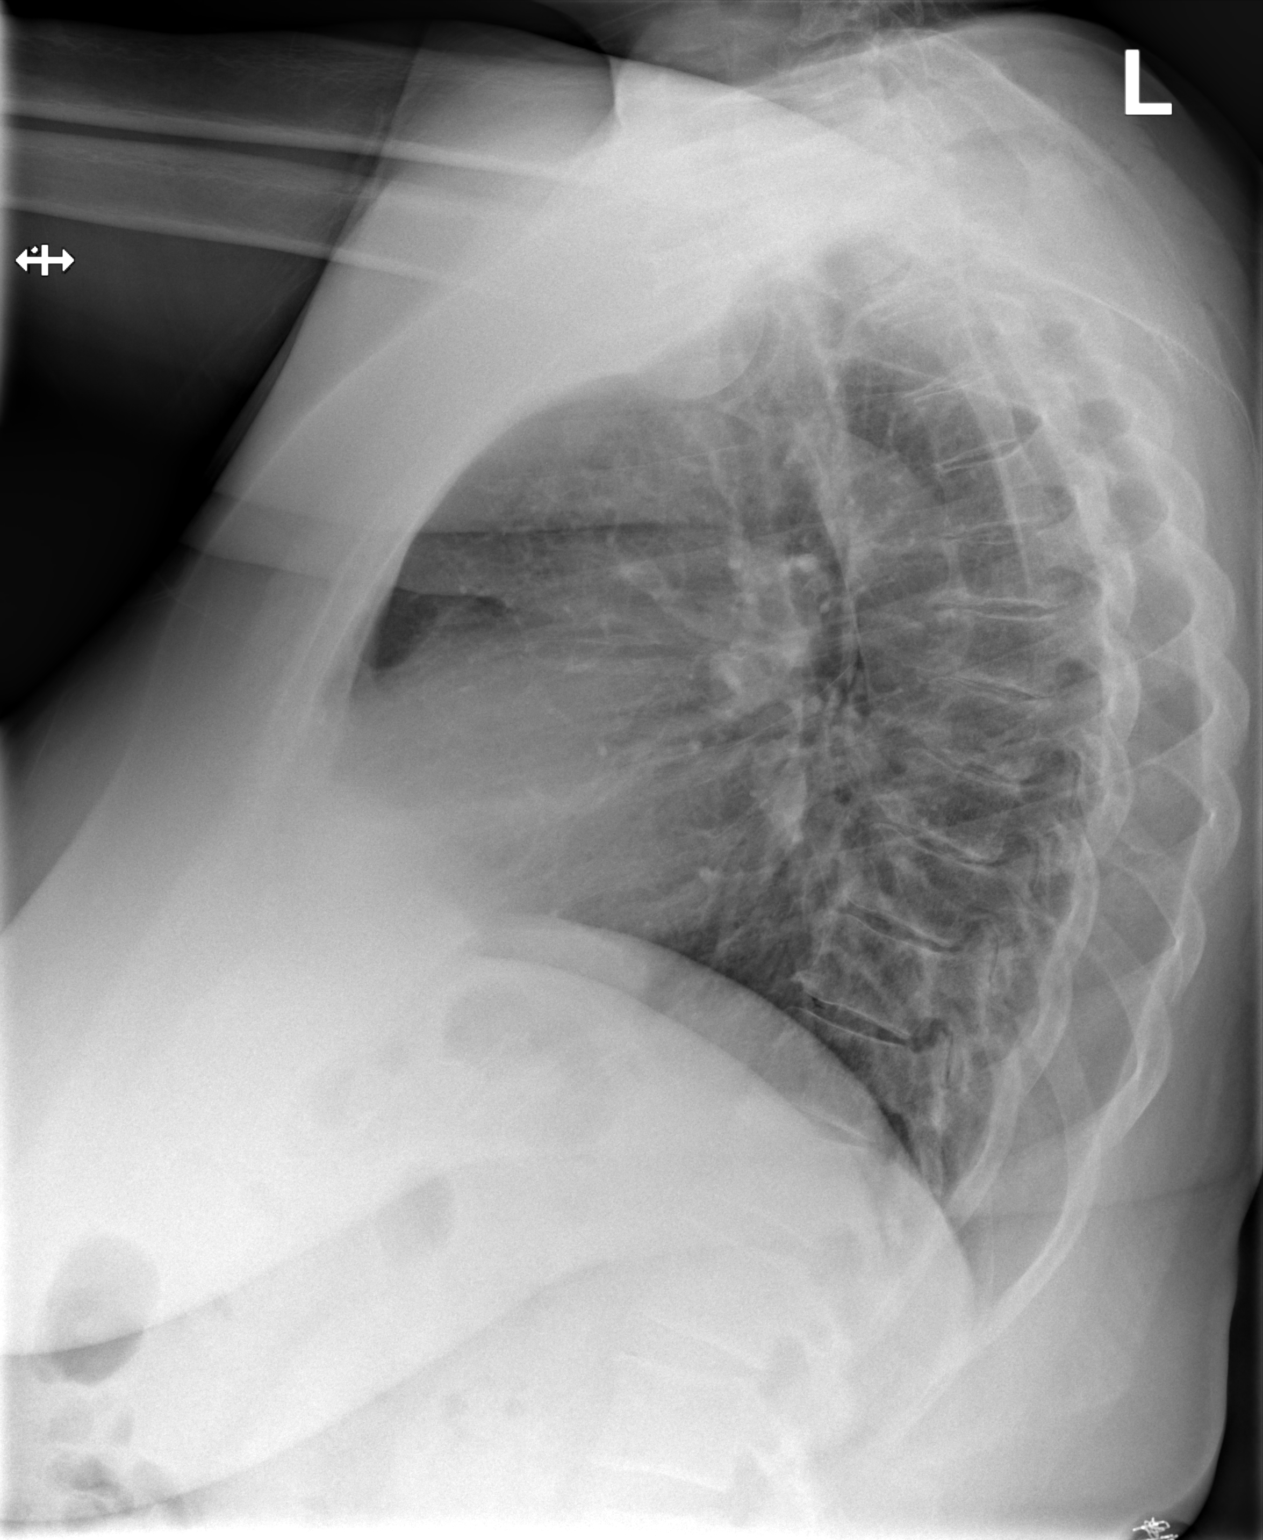

[3 of 3 positions shown; findings below may reference images not displayed]

FINDINGS: Lung volumes are normal. No consolidative airspace disease. No
pleural effusions. No pneumothorax. No pulmonary nodule or mass
noted. Pulmonary vasculature and the cardiomediastinal silhouette
are within normal limits.
IMPRESSION: No radiographic evidence of acute cardiopulmonary disease.

## 2022-08-17 ENCOUNTER — Other Ambulatory Visit (HOSPITAL_COMMUNITY): Payer: Self-pay

## 2022-08-17 MED ORDER — SAXENDA 18 MG/3ML ~~LOC~~ SOPN
2.4000 mg | PEN_INJECTOR | Freq: Every day | SUBCUTANEOUS | 0 refills | Status: AC
  Filled 2022-08-17: qty 36, 90d supply, fill #0

## 2022-11-24 ENCOUNTER — Other Ambulatory Visit (HOSPITAL_COMMUNITY): Payer: Self-pay

## 2022-11-25 ENCOUNTER — Other Ambulatory Visit (HOSPITAL_COMMUNITY): Payer: Self-pay

## 2022-11-25 MED ORDER — SAXENDA 18 MG/3ML ~~LOC~~ SOPN
3.0000 mg | PEN_INJECTOR | Freq: Every day | SUBCUTANEOUS | 0 refills | Status: DC
  Filled 2022-11-25: qty 45, 90d supply, fill #0

## 2022-11-29 ENCOUNTER — Other Ambulatory Visit (HOSPITAL_COMMUNITY): Payer: Self-pay

## 2022-12-30 ENCOUNTER — Other Ambulatory Visit (HOSPITAL_COMMUNITY): Payer: Self-pay

## 2022-12-30 MED ORDER — SAXENDA 18 MG/3ML ~~LOC~~ SOPN
3.0000 mg | PEN_INJECTOR | Freq: Every day | SUBCUTANEOUS | 3 refills | Status: AC
  Filled 2023-02-23: qty 45, 90d supply, fill #0
  Filled 2023-06-09 – 2023-06-16 (×2): qty 45, 90d supply, fill #1
  Filled 2023-09-13 – 2023-10-06 (×2): qty 45, 90d supply, fill #2

## 2023-01-02 ENCOUNTER — Other Ambulatory Visit (HOSPITAL_COMMUNITY): Payer: Self-pay

## 2023-02-23 ENCOUNTER — Other Ambulatory Visit (HOSPITAL_COMMUNITY): Payer: Self-pay

## 2023-06-09 ENCOUNTER — Other Ambulatory Visit (HOSPITAL_COMMUNITY): Payer: Self-pay

## 2023-06-16 ENCOUNTER — Other Ambulatory Visit (HOSPITAL_BASED_OUTPATIENT_CLINIC_OR_DEPARTMENT_OTHER): Payer: Self-pay

## 2023-06-16 ENCOUNTER — Other Ambulatory Visit (HOSPITAL_COMMUNITY): Payer: Self-pay

## 2023-06-22 ENCOUNTER — Other Ambulatory Visit (HOSPITAL_COMMUNITY): Payer: Self-pay

## 2023-09-13 ENCOUNTER — Other Ambulatory Visit (HOSPITAL_COMMUNITY): Payer: Self-pay

## 2023-10-06 ENCOUNTER — Other Ambulatory Visit (HOSPITAL_COMMUNITY): Payer: Self-pay
